# Patient Record
Sex: Male | Born: 1980 | Race: Black or African American | Hispanic: No | Marital: Single | Smoking: Current every day smoker
Health system: Southern US, Community
[De-identification: ages and names within clinical notes are randomized; demographics above are authoritative.]

## PROBLEM LIST (undated history)

## (undated) DIAGNOSIS — S61219A Laceration without foreign body of unspecified finger without damage to nail, initial encounter: Secondary | ICD-10-CM

---

## 2006-10-11 ENCOUNTER — Emergency Department (HOSPITAL_COMMUNITY): Admission: EM | Admit: 2006-10-11 | Discharge: 2006-10-11 | Payer: Self-pay | Admitting: Emergency Medicine

## 2009-11-29 ENCOUNTER — Emergency Department (HOSPITAL_COMMUNITY): Admission: EM | Admit: 2009-11-29 | Discharge: 2009-11-29 | Payer: Self-pay | Admitting: Emergency Medicine

## 2013-03-29 ENCOUNTER — Emergency Department (HOSPITAL_COMMUNITY)
Admission: EM | Admit: 2013-03-29 | Discharge: 2013-03-29 | Disposition: A | Discharge status: Home / Self Care | Payer: Self-pay | Attending: Emergency Medicine | Admitting: Emergency Medicine

## 2013-03-29 ENCOUNTER — Encounter (HOSPITAL_COMMUNITY): Payer: Self-pay

## 2013-03-29 DIAGNOSIS — F17200 Nicotine dependence, unspecified, uncomplicated: Secondary | ICD-10-CM | POA: Insufficient documentation

## 2013-03-29 DIAGNOSIS — R300 Dysuria: Secondary | ICD-10-CM | POA: Insufficient documentation

## 2013-03-29 DIAGNOSIS — R599 Enlarged lymph nodes, unspecified: Secondary | ICD-10-CM | POA: Insufficient documentation

## 2013-03-29 DIAGNOSIS — N342 Other urethritis: Secondary | ICD-10-CM | POA: Insufficient documentation

## 2013-03-29 LAB — URINE MICROSCOPIC-ADD ON

## 2013-03-29 LAB — URINALYSIS, ROUTINE W REFLEX MICROSCOPIC
Glucose, UA: NEGATIVE mg/dL
Hgb urine dipstick: NEGATIVE
Protein, ur: NEGATIVE mg/dL
Specific Gravity, Urine: 1.026 (ref 1.005–1.030)
pH: 5 (ref 5.0–8.0)

## 2013-03-29 MED ORDER — LIDOCAINE HCL (PF) 1 % IJ SOLN
INTRAMUSCULAR | Status: AC
  Administered 2013-03-29: 5 mL
  Filled 2013-03-29: qty 5

## 2013-03-29 MED ORDER — AZITHROMYCIN 1 G PO PACK
1.0000 g | PACK | Freq: Once | ORAL | Status: AC
  Administered 2013-03-29: 1 g via ORAL
  Filled 2013-03-29: qty 1

## 2013-03-29 MED ORDER — CEFTRIAXONE SODIUM 250 MG IJ SOLR
250.0000 mg | Freq: Once | INTRAMUSCULAR | Status: AC
  Administered 2013-03-29: 250 mg via INTRAMUSCULAR
  Filled 2013-03-29: qty 250

## 2013-03-29 NOTE — ED Notes (Signed)
Pt reports he thick, yellow penile discharge and burning with urination starting today.

## 2013-03-30 NOTE — ED Provider Notes (Signed)
Medical screening examination/treatment/procedure(s) were performed by non-physician practitioner and as supervising physician I was immediately available for consultation/collaboration.    Vida Roller, MD 03/30/13 [phone]

## 2013-03-30 NOTE — ED Provider Notes (Signed)
CSN: [id number]     Arrival date & time 03/29/13  1926 History   First MD Initiated Contact with Patient 03/29/13 1956     Chief Complaint  Patient presents with  . V71.5   (Consider location/radiation/quality/duration/timing/severity/associated sxs/prior Treatment) HPI Comments: Patient here with a day history of yellow penile discharge and burning with urination.  States has spoken with his partner who reports no symptoms herself.  Denies any previous history of same, denies fever, chills, testicle pain, groin pain or hematuria.  Patient is a 32 y.o. male presenting with male genitourinary complaint. The history is provided by the patient. No language interpreter was used.  Male GU Problem Presenting symptoms: dysuria and penile discharge   Presenting symptoms: no penile pain and no scrotal pain   Context: after urination   Relieved by:  Nothing Worsened by:  Nothing tried Ineffective treatments:  None tried Associated symptoms: no abdominal pain, no fever, no genital itching, no genital lesions, no genital rash, no groin pain, no hematuria, no penile redness, no scrotal swelling and no vomiting   Risk factors: new sexual partner and unprotected sex     History reviewed. No pertinent past medical history. History reviewed. No pertinent past surgical history. History reviewed. No pertinent family history. History  Substance Use Topics  . Smoking status: Current Every Day Smoker  . Smokeless tobacco: Not on file  . Alcohol Use: Yes    Review of Systems  Constitutional: Negative for fever.  Gastrointestinal: Negative for vomiting and abdominal pain.  Genitourinary: Positive for dysuria and discharge. Negative for hematuria, scrotal swelling and penile pain.  All other systems reviewed and are negative.    Allergies  Review of patient's allergies indicates no known allergies.  Home Medications  No current outpatient prescriptions on file. BP 129/90  Pulse 90  Temp(Src)  98.6 F (37 C) (Oral)  Resp 18  SpO2 99% Physical Exam  Nursing note and vitals reviewed. Constitutional: He appears well-developed and well-nourished. No distress.  HENT:  Head: Normocephalic and atraumatic.  Eyes: Conjunctivae are normal. Pupils are equal, round, and reactive to light. No scleral icterus.  Pulmonary/Chest: Effort normal.  Abdominal: Hernia confirmed negative in the right inguinal area and confirmed negative in the left inguinal area.  Genitourinary: Testes normal. Right testis shows no mass, no swelling and no tenderness. Left testis shows no mass, no swelling and no tenderness. Circumcised. Discharge found.  Lymphadenopathy:       Right: Inguinal adenopathy present.       Left: Inguinal adenopathy present.    ED Course  Procedures (including critical care time) Labs Review Labs Reviewed  URINALYSIS, ROUTINE W REFLEX MICROSCOPIC - Abnormal; Notable for the following:    Ketones, ur 15 (*)    Leukocytes, UA SMALL (*)    All other components within normal limits  GC/CHLAMYDIA PROBE AMP  URINE MICROSCOPIC-ADD ON  RPR  HIV ANTIBODY (ROUTINE TESTING)   Imaging Review No results found.  MDM   1. Urethritis    Patient with unprotected intercourse now with yellow discharge, he is concerned about other STD's so RPR and HIV sent as well.  Given rocephin 250mg  IM and zithromycin 1gm po to treat.    Izola Price Marisue Humble, PA-C 03/30/13 [phone]

## 2014-02-11 ENCOUNTER — Emergency Department (HOSPITAL_COMMUNITY)
Admission: EM | Admit: 2014-02-11 | Discharge: 2014-02-11 | Disposition: A | Discharge status: Home / Self Care | Payer: Self-pay | Attending: Emergency Medicine | Admitting: Emergency Medicine

## 2014-02-11 ENCOUNTER — Encounter (HOSPITAL_COMMUNITY): Payer: Self-pay | Admitting: Emergency Medicine

## 2014-02-11 DIAGNOSIS — J029 Acute pharyngitis, unspecified: Secondary | ICD-10-CM | POA: Insufficient documentation

## 2014-02-11 DIAGNOSIS — F17200 Nicotine dependence, unspecified, uncomplicated: Secondary | ICD-10-CM | POA: Insufficient documentation

## 2014-02-11 LAB — RAPID STREP SCREEN (MED CTR MEBANE ONLY): STREPTOCOCCUS, GROUP A SCREEN (DIRECT): NEGATIVE

## 2014-02-11 MED ORDER — HYDROCODONE-ACETAMINOPHEN 7.5-325 MG/15ML PO SOLN
10.0000 mL | Freq: Once | ORAL | Status: AC
  Administered 2014-02-11: 10 mL via ORAL
  Filled 2014-02-11: qty 15

## 2014-02-11 MED ORDER — LIDOCAINE VISCOUS 2 % MT SOLN
15.0000 mL | Freq: Once | OROMUCOSAL | Status: AC
  Administered 2014-02-11: 15 mL via OROMUCOSAL
  Filled 2014-02-11: qty 15

## 2014-02-11 MED ORDER — HYDROCODONE-ACETAMINOPHEN 7.5-325 MG/15ML PO SOLN: 15.0000 mL | Freq: Four times a day (QID) | ORAL | Status: DC | PRN

## 2014-02-11 NOTE — ED Notes (Signed)
Patient also ambulatory in room and in hallway without incident.

## 2014-02-11 NOTE — ED Provider Notes (Signed)
CSN: [id number]     Arrival date & time 02/11/14  [id number] History   First MD Initiated Contact with Patient 02/11/14 0458     Chief Complaint  Patient presents with  . Sore Throat     (Consider location/radiation/quality/duration/timing/severity/associated sxs/prior Treatment) Patient is a 33 y.o. male presenting with pharyngitis. The history is provided by the patient.  Sore Throat This is a new problem. The current episode started today. The problem occurs constantly. The problem has been unchanged. Associated symptoms include a sore throat and swollen glands. Pertinent negatives include no abdominal pain, anorexia, chest pain, chills, congestion, coughing, fever, headaches, myalgias, nausea, neck pain or vomiting. The symptoms are aggravated by eating and drinking. He has tried nothing for the symptoms. The treatment provided no relief.    History reviewed. No pertinent past medical history. History reviewed. No pertinent past surgical history. No family history on file. History  Substance Use Topics  . Smoking status: Current Every Day Smoker  . Smokeless tobacco: Not on file  . Alcohol Use: Yes    Review of Systems  Constitutional: Negative for fever and chills.  HENT: Positive for sore throat. Negative for congestion.   Respiratory: Negative for cough.   Cardiovascular: Negative for chest pain.  Gastrointestinal: Negative for nausea, vomiting, abdominal pain and anorexia.  Musculoskeletal: Negative for myalgias and neck pain.  Neurological: Negative for headaches.  All other systems reviewed and are negative.     Allergies  Review of patient's allergies indicates no known allergies.  Home Medications   Prior to Admission medications   Not on File   BP 132/85  Pulse 77  Temp(Src) 99.3 F (37.4 C) (Oral)  Resp 16  SpO2 100% Physical Exam  Nursing note and vitals reviewed. Constitutional: He is oriented to person, place, and time. He appears well-developed and  well-nourished. No distress.  HENT:  Head: Normocephalic and atraumatic.  Right Ear: External ear normal.  Left Ear: External ear normal.  Nose: Nose normal.  Mouth/Throat: Uvula is midline and mucous membranes are normal. No trismus in the jaw. No uvula swelling. Posterior oropharyngeal erythema present. No oropharyngeal exudate, posterior oropharyngeal edema or tonsillar abscesses.  Eyes: Conjunctivae are normal.  Neck: Normal range of motion. Neck supple.  Cardiovascular: Normal rate.   Pulmonary/Chest: Effort normal.  Abdominal: Soft.  Musculoskeletal: Normal range of motion.  Lymphadenopathy:    He has cervical adenopathy.  Neurological: He is alert and oriented to person, place, and time.  Skin: Skin is warm and dry. He is not diaphoretic.  Psychiatric: He has a normal mood and affect.    ED Course  Procedures (including critical care time) Medications  lidocaine (XYLOCAINE) 2 % viscous mouth solution 15 mL (15 mLs Mouth/Throat Given 02/11/14 0519)  HYDROcodone-acetaminophen (HYCET) 7.5-325 mg/15 ml solution 10 mL (10 mLs Oral Given 02/11/14 0547)    Labs Review Labs Reviewed  RAPID STREP SCREEN    Imaging Review No results found.   EKG Interpretation None      MDM   Final diagnoses:  Viral pharyngitis    Filed Vitals:   02/11/14 0454  BP: 132/85  Pulse: 77  Temp: 99.3 F (37.4 C)  Resp: 16   Afebrile, NAD, non-toxic appearing, AAOx4.  Pt afebrile without tonsillar exudate, negative strep. Presents with mild cervical lymphadenopathy, & dysphagia; diagnosis of viral pharyngitis. No abx indicated. DC w symptomatic tx for pain  Pt does not appear dehydrated, but did discuss importance of water rehydration. Presentation non concerning  for PTA or infxn spread to soft tissue. No trismus or uvula deviation. Specific return precautions discussed. Pt able to drink water in ED without difficulty with intact air way. Recommended PCP follow up. Patient is stable at time  of discharge      Jeannetta Ellis, PA-C 02/11/14 1610

## 2014-02-11 NOTE — Discharge Instructions (Signed)
Please follow up with your primary care physician in 1-2 days. If you do not have one please call the [facility] number listed above. Please alternate between Motrin and Tylenol every three hours for fevers and pain. Please do not drive on [place] it contains a narcotic and may make you drowsy. Please read all discharge instructions and return precautions.   Pharyngitis Pharyngitis is redness, pain, and swelling (inflammation) of your pharynx.  CAUSES  Pharyngitis is usually caused by infection. Most of the time, these infections are from viruses (viral) and are part of a cold. However, sometimes pharyngitis is caused by bacteria (bacterial). Pharyngitis can also be caused by allergies. Viral pharyngitis may be spread from person to person by coughing, sneezing, and personal items or utensils (cups, forks, spoons, toothbrushes). Bacterial pharyngitis may be spread from person to person by more intimate contact, such as kissing.  SIGNS AND SYMPTOMS  Symptoms of pharyngitis include:   Sore throat.   Tiredness (fatigue).   Low-grade fever.   Headache.  Joint pain and muscle aches.  Skin rashes.  Swollen lymph nodes.  Plaque-like film on throat or tonsils (often seen with bacterial pharyngitis). DIAGNOSIS  Your health care provider will ask you questions about your illness and your symptoms. Your medical history, along with a physical exam, is often all that is needed to diagnose pharyngitis. Sometimes, a rapid strep test is done. Other lab tests may also be done, depending on the suspected cause.  TREATMENT  Viral pharyngitis will usually get better in 3-4 days without the use of medicine. Bacterial pharyngitis is treated with medicines that kill germs (antibiotics).  HOME CARE INSTRUCTIONS   Drink enough water and fluids to keep your urine clear or pale yellow.   Only take over-the-counter or prescription medicines as directed by your health care provider:    If you are prescribed antibiotics, make sure you finish them even if you start to feel better.   Do not take aspirin.   Get lots of rest.   Gargle with 8 oz of salt water ( tsp of salt per 1 qt of water) as often as every 1-2 hours to soothe your throat.   Throat lozenges (if you are not at risk for choking) or sprays may be used to soothe your throat. SEEK MEDICAL CARE IF:   You have large, tender lumps in your neck.  You have a rash.  You cough up green, yellow-brown, or bloody spit. SEEK IMMEDIATE MEDICAL CARE IF:   Your neck becomes stiff.  You drool or are unable to swallow liquids.  You vomit or are unable to keep medicines or liquids down.  You have severe pain that does not go away with the use of recommended medicines.  You have trouble breathing (not caused by a stuffy nose). MAKE SURE YOU:   Understand these instructions.  Will watch your condition.  Will get help right away if you are not doing well or get worse. Document Released: 06/10/2005 Document Revised: 03/31/2013 Document Reviewed: 02/15/2013 [facility] Patient Information 2015 [employer] [place]. This information is not intended to replace advice given to you by your health care provider. Make sure you discuss any questions you have with your health care provider.

## 2014-02-11 NOTE — ED Notes (Signed)
Pt presents with c/o sore throat that started several hours ago. Pt says his throat is swollen on the left side and it is hard for him to swallow.

## 2014-02-11 NOTE — ED Provider Notes (Signed)
Medical screening examination/treatment/procedure(s) were performed by non-physician practitioner and as supervising physician I was immediately available for consultation/collaboration.   EKG Interpretation None       Abrina Petz M Dennison Mcdaid, MD 02/11/14 0603 

## 2014-02-11 NOTE — ED Notes (Signed)
Patient presents with "feeling like throat closing". Denies N/V, fever, chills, cough. Patient speaking in complete sentences. Patient's 02 100% on room air.

## 2014-02-13 LAB — CULTURE, GROUP A STREP

## 2014-09-01 ENCOUNTER — Emergency Department (HOSPITAL_COMMUNITY): Payer: Self-pay

## 2014-09-01 ENCOUNTER — Encounter (HOSPITAL_COMMUNITY): Payer: Self-pay | Admitting: Emergency Medicine

## 2014-09-01 ENCOUNTER — Emergency Department (HOSPITAL_COMMUNITY)
Admission: EM | Admit: 2014-09-01 | Discharge: 2014-09-02 | Disposition: A | Discharge status: Home / Self Care | Payer: Self-pay | Attending: Emergency Medicine | Admitting: Emergency Medicine

## 2014-09-01 DIAGNOSIS — S61412A Laceration without foreign body of left hand, initial encounter: Secondary | ICD-10-CM

## 2014-09-01 DIAGNOSIS — Y9289 Other specified places as the place of occurrence of the external cause: Secondary | ICD-10-CM | POA: Insufficient documentation

## 2014-09-01 DIAGNOSIS — IMO0002 Reserved for concepts with insufficient information to code with codable children: Secondary | ICD-10-CM

## 2014-09-01 DIAGNOSIS — Y998 Other external cause status: Secondary | ICD-10-CM | POA: Insufficient documentation

## 2014-09-01 DIAGNOSIS — W25XXXA Contact with sharp glass, initial encounter: Secondary | ICD-10-CM | POA: Insufficient documentation

## 2014-09-01 DIAGNOSIS — S61412S Laceration without foreign body of left hand, sequela: Secondary | ICD-10-CM | POA: Insufficient documentation

## 2014-09-01 DIAGNOSIS — Y9389 Activity, other specified: Secondary | ICD-10-CM | POA: Insufficient documentation

## 2014-09-01 DIAGNOSIS — S61219A Laceration without foreign body of unspecified finger without damage to nail, initial encounter: Secondary | ICD-10-CM

## 2014-09-01 DIAGNOSIS — Z87828 Personal history of other (healed) physical injury and trauma: Secondary | ICD-10-CM | POA: Insufficient documentation

## 2014-09-01 DIAGNOSIS — S51812A Laceration without foreign body of left forearm, initial encounter: Secondary | ICD-10-CM

## 2014-09-01 DIAGNOSIS — Z720 Tobacco use: Secondary | ICD-10-CM | POA: Insufficient documentation

## 2014-09-01 DIAGNOSIS — R011 Cardiac murmur, unspecified: Secondary | ICD-10-CM | POA: Insufficient documentation

## 2014-09-01 HISTORY — DX: Laceration without foreign body of unspecified finger without damage to nail, initial encounter: S61.219A

## 2014-09-01 MED ORDER — CEPHALEXIN 500 MG PO CAPS: 500.0000 mg | ORAL_CAPSULE | Freq: Four times a day (QID) | ORAL | Status: DC

## 2014-09-01 MED ORDER — HYDROCODONE-ACETAMINOPHEN 5-325 MG PO TABS: 1.0000 | ORAL_TABLET | ORAL | Status: DC | PRN

## 2014-09-01 MED ORDER — LIDOCAINE-EPINEPHRINE 2 %-1:100000 IJ SOLN
20.0000 mL | Freq: Once | INTRAMUSCULAR | Status: AC
  Administered 2014-09-02: 20 mL
  Filled 2014-09-01: qty 1

## 2014-09-01 MED ORDER — LIDOCAINE HCL 2 % IJ SOLN
INTRAMUSCULAR | Status: AC
  Administered 2014-09-02
  Filled 2014-09-01: qty 20

## 2014-09-01 NOTE — ED Provider Notes (Signed)
CSN: [id number]     Arrival date & time 09/01/14  2024 History   First MD Initiated Contact with Patient 09/01/14 2105     Chief Complaint  Patient presents with  . Extremity Laceration     (Consider location/radiation/quality/duration/timing/severity/associated sxs/prior Treatment) Patient is a 34 y.o. male presenting with skin laceration.  Laceration Location: L forearm, L hand. Length (cm):  6 cm, 2 cm, 6 cm in hand Depth:  Through muscle Quality: straight   Bleeding: controlled   Time since incident:  2 hours Injury mechanism: broken glass. Pain details:    Quality:  Aching   Severity:  Moderate   Timing:  Constant Foreign body present:  No foreign bodies Relieved by:  Nothing Worsened by:  Movement and pressure Ineffective treatments:  None tried Tetanus status:  Up to date   Past Medical History  Diagnosis Date  . Heart murmur     diagnosed as a child, never had any problems, per pt. and mother  . Laceration of forearm, left 09/01/2014  . Finger laceration involving tendon 09/01/2014    left long and ring fingers   Past Surgical History  Procedure Laterality Date  . No past surgeries     History reviewed. No pertinent family history. History  Substance Use Topics  . Smoking status: Current Every Day Smoker -- 10 years    Types: Cigars  . Smokeless tobacco: Never Used     Comment: 1/2 of a Black and Mild/day  . Alcohol Use: Yes     Comment: occasionally    Review of Systems  All other systems reviewed and are negative.     Allergies  Review of patient's allergies indicates no known allergies.  Home Medications   Prior to Admission medications   Medication Sig Start Date End Date Taking? Authorizing Provider  cephALEXin (KEFLEX) 500 MG capsule Take 1 capsule (500 mg total) by mouth 4 (four) times daily. 09/01/14   Mirian Mo, MD  HYDROcodone-acetaminophen (NORCO/VICODIN) 5-325 MG per tablet Take 1-2 tablets by mouth every 4 (four) hours as  needed. 09/01/14   Mirian Mo, MD  Multiple Vitamins-Minerals (RA ONE DAILY GUMMY VITES PO) Take by mouth.    Historical Provider, MD   BP 129/77 mmHg  Pulse 61  Temp(Src) 98.4 F (36.9 C) (Oral)  Resp 18  SpO2 99% Physical Exam  Constitutional: He is oriented to person, place, and time. He appears well-developed and well-nourished.  HENT:  Head: Normocephalic and atraumatic.  Eyes: Conjunctivae and EOM are normal.  Neck: Normal range of motion. Neck supple.  Cardiovascular: Normal rate, regular rhythm and normal heart sounds.   Pulmonary/Chest: Effort normal and breath sounds normal. No respiratory distress.  Abdominal: He exhibits no distension. There is no tenderness. There is no rebound and no guarding.  Musculoskeletal: Normal range of motion.  Neurological: He is alert and oriented to person, place, and time.  Skin: Skin is warm and dry.  6 cm laceration to medial L forearm, 2 cm to lateral L forearm, 6 cm laceration to L hand palmar surface extending to digit  Vitals reviewed.   ED Course  LACERATION REPAIR Date/Time: 09/02/2014 4:33 PM Performed by: Mirian Mo Authorized by: Mirian Mo Consent: Verbal consent obtained. Body area: upper extremity Location details: left lower arm Laceration length: 6 cm Tendon involvement: none Nerve involvement: none Vascular damage: no Anesthesia: local infiltration Local anesthetic: lidocaine 2% with epinephrine Patient sedated: no Preparation: Patient was prepped and draped in the usual sterile fashion.  Irrigation solution: saline Irrigation method: syringe Amount of cleaning: standard Skin closure: 3-0 Prolene Wound subcutaneous closure material used: 5-0 vicryl. Number of sutures: 11 Technique: simple Approximation: close Approximation difficulty: complex Dressing: 4x4 sterile gauze Patient tolerance: Patient tolerated the procedure well with no immediate complications  LACERATION REPAIR Date/Time:  09/02/2014 4:43 PM Performed by: Mirian MoGENTRY, MATTHEW Authorized by: Mirian MoGENTRY, MATTHEW Consent: Verbal consent obtained. Body area: upper extremity Location details: left lower arm Laceration length: 2 cm Foreign bodies: no foreign bodies Tendon involvement: none Nerve involvement: none Anesthesia: local infiltration Local anesthetic: lidocaine 2% with epinephrine Irrigation solution: saline Irrigation method: syringe Amount of cleaning: standard Debridement: none Degree of undermining: none Skin closure: 3-0 Prolene Number of sutures: 2 Technique: simple Approximation: close Approximation difficulty: simple Patient tolerance: Patient tolerated the procedure well with no immediate complications  LACERATION REPAIR Date/Time: 09/02/2014 4:47 PM Performed by: Mirian MoGENTRY, MATTHEW Authorized by: Mirian MoGENTRY, MATTHEW Consent: Verbal consent obtained. Body area: upper extremity Location details: left hand Laceration length: 6 cm Foreign bodies: no foreign bodies Tendon involvement: none Nerve involvement: none Vascular damage: no Anesthesia: local infiltration and digital block Local anesthetic: lidocaine 1% without epinephrine Preparation: Patient was prepped and draped in the usual sterile fashion. Irrigation solution: saline Irrigation method: syringe Amount of cleaning: standard Debridement: none Degree of undermining: none Skin closure: 4-0 Prolene Technique: simple Approximation: loose Approximation difficulty: complex Patient tolerance: Patient tolerated the procedure well with no immediate complications Comments: Loose approximation due to wound gaping and location   (including critical care time) Labs Review Labs Reviewed - No data to display  Imaging Review Dg Forearm Left  09/01/2014   CLINICAL DATA:  Patient's hand and arm went through glass door this afternoon. Laceration to the palmar surface of the hand and anterior forearm, midshaft.  EXAM: LEFT FOREARM - 2 VIEW   COMPARISON:  None.  FINDINGS: Gauze material around the mid left forearm. No evidence of acute fracture or subluxation. No focal bone lesion or bone destruction. Bone cortex and trabecular architecture appear intact. No radiopaque soft tissue foreign bodies.  IMPRESSION: No acute bony abnormalities in the left radius or ulna. No radiopaque soft tissue foreign bodies.   Electronically Signed   By: Burman NievesWilliam  Stevens M.D.   On: 09/01/2014 21:49   Dg Hand Complete Left  09/01/2014   CLINICAL DATA:  Left hand and arm went through glass door, laceration to palmar surface hand.  EXAM: LEFT HAND - COMPLETE 3+ VIEW  COMPARISON:  None.  FINDINGS: A dressing overlies the second, third, and fourth digits. Probable laceration about the palmar aspect in the region of the dressing. No radiopaque foreign body. No acute fracture or dislocation.  IMPRESSION: Laceration about the palmar second, third, and fourth digits. No radiopaque foreign body or acute osseous abnormality.   Electronically Signed   By: Rubye OaksMelanie  Ehinger M.D.   On: 09/01/2014 21:51     EKG Interpretation None      MDM   Final diagnoses:  Laceration  Hand laceration, left, initial encounter  Forearm laceration, left, initial encounter    34 y.o. male without pertinent PMH  presents with lacerations to LUE from broken glass.  On arrival vitals and physical exam as above.  Pt has FROM, intact strength with ? Mild limiting, unknown if from pain or from partial tendon disruption.  Consulted hand, Dr. Merlyn LotKuzma, who requested primary closure in ED and fu as outpt.  Tetanus UTD.  Complex repair as above.  NV intact before and after.  Pt dc home after splinting to fu.  Strict return precautions and abx prescribed.    I have reviewed all laboratory and imaging studies if ordered as above  1. Hand laceration, left, initial encounter   2. Laceration   3. Forearm laceration, left, initial encounter         Mirian Mo, MD 09/02/14 1650

## 2014-09-01 NOTE — ED Notes (Signed)
Pt reports his L arm through a glass door tonight.  Large lac noted to L medial FA and skin avulsion to L ring finger.  Active bleeding noted.

## 2014-09-01 NOTE — Discharge Instructions (Signed)

## 2014-09-01 NOTE — ED Notes (Signed)
Patient transported to X-ray 

## 2014-09-01 NOTE — ED Notes (Signed)
Pt states he was coming in a glass door and it went to close so he went to catch it and his hand went through it  Pt has lacerations noted to his left hand and left arm  Bleeding not controlled

## 2014-09-02 ENCOUNTER — Encounter (HOSPITAL_BASED_OUTPATIENT_CLINIC_OR_DEPARTMENT_OTHER): Payer: Self-pay | Admitting: *Deleted

## 2014-09-02 ENCOUNTER — Other Ambulatory Visit: Payer: Self-pay | Admitting: Orthopedic Surgery

## 2014-09-05 ENCOUNTER — Ambulatory Visit (HOSPITAL_BASED_OUTPATIENT_CLINIC_OR_DEPARTMENT_OTHER)
Admission: RE | Admit: 2014-09-05 | Discharge: 2014-09-05 | Disposition: A | Discharge status: Home / Self Care | Payer: Self-pay | Source: Ambulatory Visit | Attending: Orthopedic Surgery | Admitting: Orthopedic Surgery

## 2014-09-05 ENCOUNTER — Ambulatory Visit (HOSPITAL_BASED_OUTPATIENT_CLINIC_OR_DEPARTMENT_OTHER): Payer: Self-pay | Admitting: Anesthesiology

## 2014-09-05 ENCOUNTER — Encounter (HOSPITAL_BASED_OUTPATIENT_CLINIC_OR_DEPARTMENT_OTHER): Payer: Self-pay

## 2014-09-05 ENCOUNTER — Encounter (HOSPITAL_BASED_OUTPATIENT_CLINIC_OR_DEPARTMENT_OTHER)
Admission: RE | Disposition: A | Discharge status: Home / Self Care | Payer: Self-pay | Source: Ambulatory Visit | Attending: Orthopedic Surgery

## 2014-09-05 DIAGNOSIS — S64493A Injury of digital nerve of left middle finger, initial encounter: Secondary | ICD-10-CM | POA: Insufficient documentation

## 2014-09-05 DIAGNOSIS — W25XXXA Contact with sharp glass, initial encounter: Secondary | ICD-10-CM | POA: Insufficient documentation

## 2014-09-05 DIAGNOSIS — S51812A Laceration without foreign body of left forearm, initial encounter: Secondary | ICD-10-CM | POA: Insufficient documentation

## 2014-09-05 DIAGNOSIS — F17210 Nicotine dependence, cigarettes, uncomplicated: Secondary | ICD-10-CM | POA: Insufficient documentation

## 2014-09-05 DIAGNOSIS — S66123A Laceration of flexor muscle, fascia and tendon of left middle finger at wrist and hand level, initial encounter: Secondary | ICD-10-CM | POA: Insufficient documentation

## 2014-09-05 DIAGNOSIS — S66125A Laceration of flexor muscle, fascia and tendon of left ring finger at wrist and hand level, initial encounter: Secondary | ICD-10-CM | POA: Insufficient documentation

## 2014-09-05 HISTORY — DX: Laceration without foreign body of unspecified finger without damage to nail, initial encounter: S61.219A

## 2014-09-05 HISTORY — PX: WOUND EXPLORATION: SHX6188

## 2014-09-05 HISTORY — PX: NERVE, TENDON AND ARTERY REPAIR: SHX5695

## 2014-09-05 SURGERY — WOUND EXPLORATION
Anesthesia: General | Site: Hand | Laterality: Left

## 2014-09-05 MED ORDER — CEFAZOLIN SODIUM-DEXTROSE 2-3 GM-% IV SOLR
INTRAVENOUS | Status: AC
  Filled 2014-09-05: qty 50

## 2014-09-05 MED ORDER — ONDANSETRON HCL 4 MG/2ML IJ SOLN
INTRAMUSCULAR | Status: DC | PRN
  Administered 2014-09-05: 4 mg via INTRAVENOUS

## 2014-09-05 MED ORDER — FENTANYL CITRATE 0.05 MG/ML IJ SOLN
INTRAMUSCULAR | Status: AC
Start: 2014-09-05 — End: 2014-09-05
  Filled 2014-09-05: qty 6

## 2014-09-05 MED ORDER — LIDOCAINE HCL (CARDIAC) 20 MG/ML IV SOLN
INTRAVENOUS | Status: DC | PRN
  Administered 2014-09-05: 80 mg via INTRAVENOUS

## 2014-09-05 MED ORDER — DEXAMETHASONE SODIUM PHOSPHATE 10 MG/ML IJ SOLN
INTRAMUSCULAR | Status: DC | PRN
  Administered 2014-09-05: 10 mg via INTRAVENOUS

## 2014-09-05 MED ORDER — FENTANYL CITRATE 0.05 MG/ML IJ SOLN
INTRAMUSCULAR | Status: DC | PRN
  Administered 2014-09-05 (×5): 25 ug via INTRAVENOUS
  Administered 2014-09-05: 100 ug via INTRAVENOUS

## 2014-09-05 MED ORDER — MIDAZOLAM HCL 2 MG/ML PO SYRP
12.0000 mg | ORAL_SOLUTION | Freq: Once | ORAL | Status: DC | PRN
Start: 2014-09-05 — End: 2014-09-05

## 2014-09-05 MED ORDER — ONDANSETRON HCL 4 MG/2ML IJ SOLN: 4.0000 mg | Freq: Four times a day (QID) | INTRAMUSCULAR | Status: DC | PRN

## 2014-09-05 MED ORDER — MIDAZOLAM HCL 2 MG/2ML IJ SOLN: 1.0000 mg | INTRAMUSCULAR | Status: DC | PRN

## 2014-09-05 MED ORDER — BUPIVACAINE HCL (PF) 0.25 % IJ SOLN
INTRAMUSCULAR | Status: AC
  Filled 2014-09-05: qty 30

## 2014-09-05 MED ORDER — FENTANYL CITRATE 0.05 MG/ML IJ SOLN: 50.0000 ug | INTRAMUSCULAR | Status: DC | PRN

## 2014-09-05 MED ORDER — CEFAZOLIN SODIUM-DEXTROSE 2-3 GM-% IV SOLR
2.0000 g | INTRAVENOUS | Status: AC
  Administered 2014-09-05: 2 g via INTRAVENOUS

## 2014-09-05 MED ORDER — FENTANYL CITRATE 0.05 MG/ML IJ SOLN: 25.0000 ug | INTRAMUSCULAR | Status: DC | PRN

## 2014-09-05 MED ORDER — OXYCODONE HCL 5 MG PO TABS
5.0000 mg | ORAL_TABLET | Freq: Once | ORAL | Status: AC | PRN
  Administered 2014-09-05: 5 mg via ORAL

## 2014-09-05 MED ORDER — PROPOFOL 10 MG/ML IV BOLUS
INTRAVENOUS | Status: DC | PRN
  Administered 2014-09-05: 200 mg via INTRAVENOUS

## 2014-09-05 MED ORDER — OXYCODONE HCL 5 MG PO TABS
ORAL_TABLET | ORAL | Status: AC
  Filled 2014-09-05: qty 1

## 2014-09-05 MED ORDER — CHLORHEXIDINE GLUCONATE 4 % EX LIQD: 60.0000 mL | Freq: Once | CUTANEOUS | Status: DC

## 2014-09-05 MED ORDER — LACTATED RINGERS IV SOLN
INTRAVENOUS | Status: DC
  Administered 2014-09-05 (×2): via INTRAVENOUS

## 2014-09-05 MED ORDER — MIDAZOLAM HCL 5 MG/5ML IJ SOLN
INTRAMUSCULAR | Status: DC | PRN
  Administered 2014-09-05: 2 mg via INTRAVENOUS

## 2014-09-05 MED ORDER — MIDAZOLAM HCL 2 MG/2ML IJ SOLN
INTRAMUSCULAR | Status: AC
  Filled 2014-09-05: qty 2

## 2014-09-05 MED ORDER — OXYCODONE-ACETAMINOPHEN 5-325 MG PO TABS: ORAL_TABLET | ORAL | Status: DC

## 2014-09-05 MED ORDER — HEPARIN SODIUM (PORCINE) 1000 UNIT/ML IJ SOLN
INTRAMUSCULAR | Status: AC
  Filled 2014-09-05: qty 1

## 2014-09-05 MED ORDER — OXYCODONE HCL 5 MG/5ML PO SOLN: 5.0000 mg | Freq: Once | ORAL | Status: AC | PRN

## 2014-09-05 MED ORDER — BUPIVACAINE HCL (PF) 0.25 % IJ SOLN
INTRAMUSCULAR | Status: DC | PRN
  Administered 2014-09-05: 10 mL

## 2014-09-05 MED ORDER — LIDOCAINE HCL (PF) 1 % IJ SOLN
INTRAMUSCULAR | Status: AC
  Filled 2014-09-05: qty 30

## 2014-09-05 SURGICAL SUPPLY — 68 items
BAG DECANTER FOR FLEXI CONT (MISCELLANEOUS) IMPLANT
BANDAGE ELASTIC 3 VELCRO ST LF (GAUZE/BANDAGES/DRESSINGS) ×1 IMPLANT
BLADE MINI RND TIP GREEN BEAV (BLADE) IMPLANT
BLADE SURG 15 STRL LF DISP TIS (BLADE) ×2 IMPLANT
BLADE SURG 15 STRL SS (BLADE) ×6
BNDG CMPR 9X4 STRL LF SNTH (GAUZE/BANDAGES/DRESSINGS) ×1
BNDG ESMARK 4X9 LF (GAUZE/BANDAGES/DRESSINGS) ×2 IMPLANT
BNDG GAUZE ELAST 4 BULKY (GAUZE/BANDAGES/DRESSINGS) ×3 IMPLANT
BRUSH SCRUB EZ PLAIN DRY (MISCELLANEOUS) ×3 IMPLANT
CHLORAPREP W/TINT 26ML (MISCELLANEOUS) ×1 IMPLANT
CORDS BIPOLAR (ELECTRODE) ×3 IMPLANT
COVER BACK TABLE 60X90IN (DRAPES) ×3 IMPLANT
COVER MAYO STAND STRL (DRAPES) ×3 IMPLANT
CUFF TOURNIQUET SINGLE 18IN (TOURNIQUET CUFF) ×2 IMPLANT
DECANTER SPIKE VIAL GLASS SM (MISCELLANEOUS) ×1 IMPLANT
DRAPE EXTREMITY T 121X128X90 (DRAPE) ×3 IMPLANT
DRAPE SURG 17X23 STRL (DRAPES) ×3 IMPLANT
GAUZE SPONGE 4X4 12PLY STRL (GAUZE/BANDAGES/DRESSINGS) ×3 IMPLANT
GAUZE XEROFORM 1X8 LF (GAUZE/BANDAGES/DRESSINGS) ×3 IMPLANT
GLOVE BIO SURGEON STRL SZ 6.5 (GLOVE) ×1 IMPLANT
GLOVE BIO SURGEON STRL SZ7.5 (GLOVE) ×3 IMPLANT
GLOVE BIO SURGEONS STRL SZ 6.5 (GLOVE) ×1
GLOVE BIOGEL M STRL SZ7.5 (GLOVE) ×2 IMPLANT
GLOVE BIOGEL PI IND STRL 7.0 (GLOVE) IMPLANT
GLOVE BIOGEL PI IND STRL 8 (GLOVE) ×1 IMPLANT
GLOVE BIOGEL PI IND STRL 8.5 (GLOVE) IMPLANT
GLOVE BIOGEL PI INDICATOR 7.0 (GLOVE) ×2
GLOVE BIOGEL PI INDICATOR 8 (GLOVE) ×4
GLOVE BIOGEL PI INDICATOR 8.5 (GLOVE) ×6
GLOVE SURG ORTHO 8.0 STRL STRW (GLOVE) ×2 IMPLANT
GOWN STRL REUS W/ TWL LRG LVL3 (GOWN DISPOSABLE) ×1 IMPLANT
GOWN STRL REUS W/TWL LRG LVL3 (GOWN DISPOSABLE) ×3
GOWN STRL REUS W/TWL XL LVL3 (GOWN DISPOSABLE) ×5 IMPLANT
LOOP VESSEL MAXI BLUE (MISCELLANEOUS) IMPLANT
NDL HYPO 25X1 1.5 SAFETY (NEEDLE) IMPLANT
NDL SAFETY ECLIPSE 18X1.5 (NEEDLE) IMPLANT
NEEDLE HYPO 18GX1.5 SHARP (NEEDLE)
NEEDLE HYPO 25X1 1.5 SAFETY (NEEDLE) ×3 IMPLANT
NS IRRIG 1000ML POUR BTL (IV SOLUTION) ×3 IMPLANT
PACK BASIN DAY SURGERY FS (CUSTOM PROCEDURE TRAY) ×3 IMPLANT
PAD CAST 3X4 CTTN HI CHSV (CAST SUPPLIES) ×1 IMPLANT
PAD CAST 4YDX4 CTTN HI CHSV (CAST SUPPLIES) IMPLANT
PADDING CAST ABS 4INX4YD NS (CAST SUPPLIES)
PADDING CAST ABS COTTON 4X4 ST (CAST SUPPLIES) ×1 IMPLANT
PADDING CAST COTTON 3X4 STRL (CAST SUPPLIES) ×3
PADDING CAST COTTON 4X4 STRL (CAST SUPPLIES)
SLEEVE SCD COMPRESS KNEE MED (MISCELLANEOUS) ×2 IMPLANT
SPEAR EYE SURG WECK-CEL (MISCELLANEOUS) ×3 IMPLANT
SPLINT PLASTER CAST XFAST 3X15 (CAST SUPPLIES) IMPLANT
SPLINT PLASTER XTRA FASTSET 3X (CAST SUPPLIES) ×28
STOCKINETTE 4X48 STRL (DRAPES) ×3 IMPLANT
SUT ETHIBOND 3-0 V-5 (SUTURE) IMPLANT
SUT ETHILON 3 0 PS 1 (SUTURE) ×2 IMPLANT
SUT ETHILON 4 0 PS 2 18 (SUTURE) ×3 IMPLANT
SUT FIBERWIRE 4-0 18 TAPR NDL (SUTURE) ×3
SUT MERSILENE 6 0 P 1 (SUTURE) IMPLANT
SUT NYLON 9 0 VRM6 (SUTURE) ×2 IMPLANT
SUT PROLENE 6 0 P 1 18 (SUTURE) IMPLANT
SUT SILK 2 0 FS (SUTURE) ×2 IMPLANT
SUT SILK 4 0 PS 2 (SUTURE) IMPLANT
SUT SUPRAMID 4-0 (SUTURE) ×2 IMPLANT
SUT VICRYL 4-0 PS2 18IN ABS (SUTURE) IMPLANT
SUTURE FIBERWR 4-0 18 TAPR NDL (SUTURE) IMPLANT
SYR BULB 3OZ (MISCELLANEOUS) ×3 IMPLANT
SYR CONTROL 10ML LL (SYRINGE) ×2 IMPLANT
TOWEL OR 17X24 6PK STRL BLUE (TOWEL DISPOSABLE) ×6 IMPLANT
TRAY DSU PREP LF (CUSTOM PROCEDURE TRAY) IMPLANT
UNDERPAD 30X30 INCONTINENT (UNDERPADS AND DIAPERS) ×3 IMPLANT

## 2014-09-05 NOTE — Brief Op Note (Signed)
09/05/2014  3:08 PM  PATIENT:  Kyle Snyder  34 y.o. male  PRE-OPERATIVE DIAGNOSIS:  LEFT LONG RING TENDON LACERATIONS/LEFT FOREARM LACERATION  POST-OPERATIVE DIAGNOSIS:  LEFT LONG RING TENDON LACERATIONS/LEFT FOREARM LACERATION  PROCEDURE:  Procedure(s): LEFT LONG RING /FOREARM EXPLORATION  WOUNDS (Left) LEFT NERVE, TENDON AND ARTERY REPAIR (Left)  SURGEON:  Surgeon(s) and Role:    * Betha LoaKevin Remedios Mckone, MD - Primary    * Cindee SaltGary Shanyn Preisler, MD - Assisting  PHYSICIAN ASSISTANT:   ASSISTANTS: Cindee SaltGary Willella Harding, MD   ANESTHESIA:   general  EBL:  Total I/O In: 1200 [I.V.:1200] Out: -   BLOOD ADMINISTERED:none  DRAINS: none   LOCAL MEDICATIONS USED:  MARCAINE     SPECIMEN:  No Specimen  DISPOSITION OF SPECIMEN:  N/A  COUNTS:  YES  TOURNIQUET:   Total Tourniquet Time Documented: Upper Arm (Left) - 85 minutes Total: Upper Arm (Left) - 85 minutes   DICTATION: .Other Dictation: Dictation Number [id number]  PLAN OF CARE: Discharge to home after PACU  PATIENT DISPOSITION:  PACU - hemodynamically stable.

## 2014-09-05 NOTE — Discharge Instructions (Addendum)

## 2014-09-05 NOTE — H&P (Signed)
  Kyle NordmannShawron Mcneely is an 34 y.o. male.   Chief Complaint: left long, ring, forearm lacerations HPI: 34 yo rhd male states he lacerated left hand and forearm on glass door 09/01/14.  [facility].  Followed up in office.  Difficulty with flexion of long and ring fingers at pip joint.  Past Medical History  Diagnosis Date  . Heart murmur     diagnosed as a child, never had any problems, per pt. and mother  . Laceration of forearm, left 09/01/2014  . Finger laceration involving tendon 09/01/2014    left long and ring fingers    Past Surgical History  Procedure Laterality Date  . No past surgeries      History reviewed. No pertinent family history. Social History:  reports that he has been smoking Cigars.  He has never used smokeless tobacco. He reports that he drinks alcohol. He reports that he does not use illicit drugs.  Allergies: No Known Allergies  Medications Prior to Admission  Medication Sig Dispense Refill  . cephALEXin (KEFLEX) 500 MG capsule Take 1 capsule (500 mg total) by mouth 4 (four) times daily. 28 capsule 0  . HYDROcodone-acetaminophen (NORCO/VICODIN) 5-325 MG per tablet Take 1-2 tablets by mouth every 4 (four) hours as needed. 10 tablet 0  . Multiple Vitamins-Minerals (RA ONE DAILY GUMMY VITES PO) Take by mouth.      No results found for this or any previous visit (from the past 48 hour(s)).  No results found.   A comprehensive review of systems was negative except for: Eyes: positive for contacts/glasses  Blood pressure 130/65, pulse 55, temperature 98.2 F (36.8 C), temperature source Oral, resp. rate 20, height 5\' 9"  (1.753 m), weight 61.689 kg (136 lb), SpO2 100 %.  General appearance: alert, cooperative and appears stated age Head: Normocephalic, without obvious abnormality, atraumatic Neck: supple, symmetrical, trachea midline Resp: clear to auscultation bilaterally Cardio: regular rate and rhythm GI: non tender Extremities:  intact sensation and capillary refill all digits.  +epl/fpl/io.  laceration in palm left hand and medial side of forearm.  difficulty with flexion of pip long and ring fingers.   Pulses: 2+ and symmetric Skin: Skin color, texture, turgor normal. No rashes or lesions Neurologic: Grossly normal Incision/Wound: As above  Assessment/Plan Left hand and forearm lacerations with possible tendon lacerations.  Non operative and operative treatment options were discussed with the patient and patient wishes to proceed with operative treatment. Risks, benefits, and alternatives of surgery were discussed and the patient agrees with the plan of care.   Dorma Altman R 09/05/2014, 12:19 PM

## 2014-09-05 NOTE — Op Note (Signed)
[id number] 

## 2014-09-05 NOTE — Anesthesia Preprocedure Evaluation (Signed)
Anesthesia Evaluation  Patient identified by MRN, date of birth, ID band Patient awake    Reviewed: Allergy & Precautions, NPO status , Patient's Chart, lab work & pertinent test results  Airway Mallampati: II   Neck ROM: full    Dental   Pulmonary Current Smoker,    breath sounds clear to auscultation       Cardiovascular negative cardio ROS   Rhythm:regular Rate:Normal     Neuro/Psych    GI/Hepatic   Endo/Other    Renal/GU      Musculoskeletal   Abdominal   Peds  Hematology   Anesthesia Other Findings   Reproductive/Obstetrics                             Anesthesia Physical Anesthesia Plan  ASA: II  Anesthesia Plan: General   Post-op Pain Management:    Induction: Intravenous  Airway Management Planned: LMA  Additional Equipment:   Intra-op Plan:   Post-operative Plan:   Informed Consent: I have reviewed the patients History and Physical, chart, labs and discussed the procedure including the risks, benefits and alternatives for the proposed anesthesia with the patient or authorized representative who has indicated his/her understanding and acceptance.     Plan Discussed with: CRNA, Anesthesiologist and Surgeon  Anesthesia Plan Comments:         Anesthesia Quick Evaluation  

## 2014-09-05 NOTE — Anesthesia Postprocedure Evaluation (Signed)
Anesthesia Post Note  Patient: Kyle NordmannShawron Snyder  Procedure(s) Performed: Procedure(s) (LRB): LEFT LONG RING /FOREARM EXPLORATION  WOUNDS (Left) LEFT NERVE, TENDON AND ARTERY REPAIR (Left)  Anesthesia type: General  Patient location: PACU  Post pain: Pain level controlled and Adequate analgesia  Post assessment: Post-op Vital signs reviewed, Patient's Cardiovascular Status Stable, Respiratory Function Stable, Patent Airway and Pain level controlled  Last Vitals:  Filed Vitals:   09/05/14 1545  BP: 135/88  Pulse: 56  Temp:   Resp: 13    Post vital signs: Reviewed and stable  Level of consciousness: awake, alert  and oriented  Complications: No apparent anesthesia complications

## 2014-09-05 NOTE — Anesthesia Procedure Notes (Signed)
Procedure Name: LMA Insertion Date/Time: 09/05/2014 1:21 PM Performed by: Burna CashONRAD, Aniyah Nobis C Pre-anesthesia Checklist: Patient identified, Emergency Drugs available, Suction available and Patient being monitored Patient Re-evaluated:Patient Re-evaluated prior to inductionOxygen Delivery Method: Circle System Utilized Preoxygenation: Pre-oxygenation with 100% oxygen Intubation Type: IV induction Ventilation: Mask ventilation without difficulty LMA: LMA inserted LMA Size: 5.0 Number of attempts: 1 Airway Equipment and Method: Bite block Placement Confirmation: positive ETCO2 Tube secured with: Tape Dental Injury: Teeth and Oropharynx as per pre-operative assessment

## 2014-09-05 NOTE — Transfer of Care (Signed)
Immediate Anesthesia Transfer of Care Note  Patient: Kyle NordmannShawron Snyder  Procedure(s) Performed: Procedure(s): LEFT LONG RING Ocie Bob/FOREARM EXPLORATION  WOUNDS (Left) LEFT NERVE, TENDON AND ARTERY REPAIR (Left)  Patient Location: PACU  Anesthesia Type:General  Level of Consciousness: sedated  Airway & Oxygen Therapy: Patient Spontanous Breathing and Patient connected to face mask oxygen  Post-op Assessment: Report given to RN and Post -op Vital signs reviewed and stable  Post vital signs: Reviewed and stable  Last Vitals:  Filed Vitals:   09/05/14 1141  BP: 130/65  Pulse: 55  Temp: 36.8 C  Resp: 20    Complications: No apparent anesthesia complications

## 2014-09-06 ENCOUNTER — Encounter (HOSPITAL_BASED_OUTPATIENT_CLINIC_OR_DEPARTMENT_OTHER): Payer: Self-pay | Admitting: Orthopedic Surgery

## 2014-09-06 NOTE — Op Note (Signed)
NAMEJOMES, Kyle Snyder NO.:  [id number]  MEDICAL RECORD NO.:  [id number]  LOCATION:                                 FACILITY:  PHYSICIAN:  Betha Loa, MD        DATE OF BIRTH:  27-Apr-1981  DATE OF PROCEDURE:  09/05/2014 DATE OF DISCHARGE:                              OPERATIVE REPORT   PREOPERATIVE DIAGNOSES:  Left forearm laceration, left hand laceration, left ring finger laceration.  POSTOPERATIVE DIAGNOSES:  Left forearm laceration, left ring finger lacerations as well as left long finger FDP, FDS laceration, and ulnar digital nerve laceration.  PROCEDURE:   1. Exploration of left forearm wounds 2. Exploration of left ring finger wound 3. Repair of FDP of long finger in zone 2 4. Repair of FDS of long finger in zone 2 5. Repair of left long finger ulnar digital nerve under microscope.  SURGEON:  Betha Loa, MD.  ASSISTANT:  Cindee Salt, MD.  ANESTHESIA:  General.  IV FLUIDS:  Per anesthesia flow sheet.  ESTIMATED BLOOD LOSS:  Minimal.  COMPLICATIONS:  None.  SPECIMENS:  None.  TOURNIQUET TIME:  85 minutes.  DISPOSITION:  Stable to PACU.  INDICATIONS:  [name] is a 34 year old male who last week lacerated his left hand and forearm while moving furniture and his hand went through a glass paint.  He was seen at [facility] Department where the wounds were irrigated, debrided, and sutured.  He was followed up in the office.  He had difficulty with flexion of the long finger.  I recommended exploration of the wounds with repair of tendon, artery, and nerve as necessary.  Risks, benefits, and alternatives of the surgery were discussed including risk of blood loss, infection; damage to nerves, vessels, tendons, ligaments, bone; failure of surgery; need for additional surgery, complications with wound healing, continued pain, and stiffness.  He voiced understanding of these risks and elected to proceed.  OPERATIVE COURSE:  After  being identified preoperatively by myself, the patient and I agreed upon procedure and site procedure.  Surgical site was marked.  The risks, benefits, and alternatives of surgery were reviewed and he wished to proceed.  Surgical consent had been signed. He was given IV Ancef as preoperative antibiotic prophylaxis.  He was transferred to the operating room and placed on the operating table in supine position with the left upper extremity on arm board.  General anesthesia was induced by anesthesiologist.  The left upper extremity was prepped and draped in normal sterile orthopedic fashion.  Surgical pause was performed between surgeons, anesthesia, operating staff, and all were in agreement as to the patient, procedure, and site of procedure.  All sutures were removed.  The forearm wounds were explored first.  There were 2 wounds.  The fascia was violated at the ulnar-sided wound but the wound did not go deep to the muscle.  The more radial sided wound was smaller the fascia was not violated.  No tendon artery or nerve laceration was noted in the proximal forearm.  The wounds were copiously irrigated with sterile saline and closed with 3-0 nylon in a horizontal mattress fashion.  Attention was turned to the  hand.  There was an U-shaped laceration at the distal palmar flexion crease across the long and ring fingers.  There was an additional wound longitudinally in the ring finger over the proximal middle phalanges with some skin loss.  An area of devitalized skin was removed at the ulnar side of the U-shaped wound in the palm.  The wounds were explored.  The wound in the ring finger itself was explored and the ulnar digital nerve and artery as well as radial digital nerve and artery were identified and were intact. There was no tendon laceration.  The sheath was not violated in the finger itself.  The wound in the hand, the radial and ulnar digital nerve and artery as well as the common  digital nerve and artery were identified to the ring finger and were intact.  There was violation of the sheath between the A1 and A2 pulleys.  The finger was placed through a range of motion and no tendon laceration was noted.  Over the long finger, the sheath was violated at A1.  The A1 pulley was released. There was a complete laceration of the FDS tendon and approximately 75% or greater laceration to the FDP tendon.  The radial digital nerve and artery were identified and were intact.  The ulnar digital artery was intact and the ulnar digital nerve was approximately 50% lacerated.  The wounds were all copiously irrigated with sterile saline.  The FDP and FDS tendons to the long finger were repaired with a 4-0 FiberWire suture in a modified Kessler technique.  An epitenon suture was placed in a running fashion with a 6-0 Prolene suture.  Good apposition of tendon was achieved.  The finger was able to be placed through his range of motion.  The tendon was easily slid into the sheath at A2.  The A1 pulley had been released.  The microscope was brought in and the ulnar digital nerve cleared of soft tissue interposition and clot.  A 9-0 nylon suture was used in an interrupted fashion to repair the nerve ends.  Good apposition was obtained.  The wounds were copiously irrigated with sterile saline again.  They were then closed with 4-0 nylon in a horizontal mattress fashion.  The wound had been extended proximally to aid in visualization and retrieval of the proximal tendon ends.  The wounds were injected with 10 mL of 0.25% plain Marcaine to aid in postoperative analgesia.  They are then dressed with sterile Xeroform, 4x4s, and wrapped with a Kerlix bandage.  A dorsal blocking splint was placed with the MPs flexed and IPs extended and the wrist at approximately 30-40 degrees flexion.  This was wrapped with Kerlix and Ace bandage.  Tourniquet was deflated at 85 minutes.  Fingertips were pink  with brisk capillary refill after deflation of tourniquet. Operative drapes were broken down.  The patient was awoken from anesthesia safely.  He was transferred back to stretcher and taken to PACU in stable condition.  I will see him back in the office in 1 week for postoperative followup.  I will give him Percocet 5/325, 1-2 p.o. q.6 hours p.r.n. pain, dispensed #40.     Betha LoaKevin Jadarious Dobbins, MD     KK/MEDQ  D:  09/05/2014  T:  09/06/2014  Job:  [id number]

## 2015-05-02 ENCOUNTER — Emergency Department (HOSPITAL_COMMUNITY): Payer: Self-pay

## 2015-05-02 ENCOUNTER — Encounter (HOSPITAL_COMMUNITY): Payer: Self-pay

## 2015-05-02 ENCOUNTER — Inpatient Hospital Stay (HOSPITAL_COMMUNITY)
Admission: EM | Admit: 2015-05-02 | Discharge: 2015-05-06 | DRG: 516 | Disposition: A | Discharge status: Home / Self Care | Payer: Self-pay | Attending: Family Medicine | Admitting: Family Medicine

## 2015-05-02 DIAGNOSIS — Y9302 Activity, running: Secondary | ICD-10-CM | POA: Diagnosis present

## 2015-05-02 DIAGNOSIS — F22 Delusional disorders: Secondary | ICD-10-CM | POA: Diagnosis present

## 2015-05-02 DIAGNOSIS — T796XXA Traumatic ischemia of muscle, initial encounter: Secondary | ICD-10-CM

## 2015-05-02 DIAGNOSIS — R441 Visual hallucinations: Secondary | ICD-10-CM

## 2015-05-02 DIAGNOSIS — F23 Brief psychotic disorder: Secondary | ICD-10-CM | POA: Diagnosis present

## 2015-05-02 DIAGNOSIS — S82009A Unspecified fracture of unspecified patella, initial encounter for closed fracture: Secondary | ICD-10-CM

## 2015-05-02 DIAGNOSIS — M542 Cervicalgia: Secondary | ICD-10-CM | POA: Diagnosis present

## 2015-05-02 DIAGNOSIS — Z823 Family history of stroke: Secondary | ICD-10-CM

## 2015-05-02 DIAGNOSIS — M6282 Rhabdomyolysis: Secondary | ICD-10-CM | POA: Diagnosis present

## 2015-05-02 DIAGNOSIS — N179 Acute kidney failure, unspecified: Secondary | ICD-10-CM | POA: Diagnosis present

## 2015-05-02 DIAGNOSIS — E872 Acidosis, unspecified: Secondary | ICD-10-CM

## 2015-05-02 DIAGNOSIS — S82002A Unspecified fracture of left patella, initial encounter for closed fracture: Secondary | ICD-10-CM

## 2015-05-02 DIAGNOSIS — W1789XA Other fall from one level to another, initial encounter: Secondary | ICD-10-CM | POA: Diagnosis present

## 2015-05-02 DIAGNOSIS — F17200 Nicotine dependence, unspecified, uncomplicated: Secondary | ICD-10-CM | POA: Diagnosis present

## 2015-05-02 DIAGNOSIS — S82042A Displaced comminuted fracture of left patella, initial encounter for closed fracture: Principal | ICD-10-CM | POA: Diagnosis present

## 2015-05-02 DIAGNOSIS — Y9289 Other specified places as the place of occurrence of the external cause: Secondary | ICD-10-CM

## 2015-05-02 LAB — RAPID URINE DRUG SCREEN, HOSP PERFORMED
AMPHETAMINES: NOT DETECTED
BARBITURATES: NOT DETECTED
Benzodiazepines: NOT DETECTED
COCAINE: NOT DETECTED
Opiates: NOT DETECTED
TETRAHYDROCANNABINOL: NOT DETECTED

## 2015-05-02 LAB — CBC WITH DIFFERENTIAL/PLATELET
BASOS PCT: 0 %
Basophils Absolute: 0 10*3/uL (ref 0.0–0.1)
EOS ABS: 0 10*3/uL (ref 0.0–0.7)
Eosinophils Relative: 1 %
HCT: 46.2 % (ref 39.0–52.0)
Hemoglobin: 15.4 g/dL (ref 13.0–17.0)
LYMPHS ABS: 2.2 10*3/uL (ref 0.7–4.0)
Lymphocytes Relative: 35 %
MCH: 30.8 pg (ref 26.0–34.0)
MCHC: 33.3 g/dL (ref 30.0–36.0)
MCV: 92.4 fL (ref 78.0–100.0)
MONO ABS: 0.4 10*3/uL (ref 0.1–1.0)
MONOS PCT: 6 %
Neutro Abs: 3.7 10*3/uL (ref 1.7–7.7)
Neutrophils Relative %: 58 %
Platelets: 284 10*3/uL (ref 150–400)
RBC: 5 MIL/uL (ref 4.22–5.81)
RDW: 13 % (ref 11.5–15.5)
WBC: 6.3 10*3/uL (ref 4.0–10.5)

## 2015-05-02 LAB — ETHANOL: Alcohol, Ethyl (B): <5 mg/dL (ref <5)

## 2015-05-02 MED ORDER — OXYCODONE-ACETAMINOPHEN 5-325 MG PO TABS
1.0000 | ORAL_TABLET | Freq: Once | ORAL | Status: AC
  Administered 2015-05-02: 1 via ORAL
  Filled 2015-05-02: qty 1
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
## 2015-05-02 NOTE — ED Notes (Signed)
Patient c/o knee and neck pain.  Denies taking any medications for pain, pain scale 9/10.  NAD at this time.
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
## 2015-05-02 NOTE — ED Notes (Signed)
Blood in lab per Alaina RN prior to leaving her shift
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
## 2015-05-03 ENCOUNTER — Other Ambulatory Visit: Payer: Self-pay | Admitting: Orthopaedic Surgery

## 2015-05-03 ENCOUNTER — Encounter (HOSPITAL_COMMUNITY): Payer: Self-pay | Admitting: Family Medicine

## 2015-05-03 DIAGNOSIS — F29 Unspecified psychosis not due to a substance or known physiological condition: Secondary | ICD-10-CM

## 2015-05-03 DIAGNOSIS — N179 Acute kidney failure, unspecified: Secondary | ICD-10-CM

## 2015-05-03 DIAGNOSIS — F23 Brief psychotic disorder: Secondary | ICD-10-CM | POA: Diagnosis present

## 2015-05-03 DIAGNOSIS — S82002A Unspecified fracture of left patella, initial encounter for closed fracture: Secondary | ICD-10-CM

## 2015-05-03 DIAGNOSIS — S82009A Unspecified fracture of unspecified patella, initial encounter for closed fracture: Secondary | ICD-10-CM | POA: Insufficient documentation

## 2015-05-03 DIAGNOSIS — T796XXA Traumatic ischemia of muscle, initial encounter: Secondary | ICD-10-CM

## 2015-05-03 DIAGNOSIS — M6282 Rhabdomyolysis: Secondary | ICD-10-CM

## 2015-05-03 DIAGNOSIS — R441 Visual hallucinations: Secondary | ICD-10-CM | POA: Insufficient documentation

## 2015-05-03 LAB — RENAL FUNCTION PANEL
ALBUMIN: 3.9 g/dL (ref 3.5–5.0)
ANION GAP: 14 (ref 5–15)
ANION GAP: 9 (ref 5–15)
Albumin: 3.5 g/dL (ref 3.5–5.0)
Albumin: 3.5 g/dL (ref 3.5–5.0)
Albumin: 3.3 g/dL — ABNORMAL LOW (ref 3.5–5.0)
Anion gap: 10 (ref 5–15)
Anion gap: 10 (ref 5–15)
BUN: 15 mg/dL (ref 6–20)
BUN: 15 mg/dL (ref 6–20)
BUN: 14 mg/dL (ref 6–20)
BUN: 13 mg/dL (ref 6–20)
CALCIUM: 8 mg/dL — AB (ref 8.9–10.3)
CALCIUM: 8.1 mg/dL — AB (ref 8.9–10.3)
CHLORIDE: 108 mmol/L (ref 101–111)
CHLORIDE: 107 mmol/L (ref 101–111)
CHLORIDE: 106 mmol/L (ref 101–111)
CO2: 19 mmol/L — AB (ref 22–32)
CO2: 22 mmol/L (ref 22–32)
CO2: 21 mmol/L — AB (ref 22–32)
CO2: 24 mmol/L (ref 22–32)
CREATININE: 1.33 mg/dL — AB (ref 0.61–1.24)
Calcium: 7.9 mg/dL — ABNORMAL LOW (ref 8.9–10.3)
Calcium: 8 mg/dL — ABNORMAL LOW (ref 8.9–10.3)
Chloride: 108 mmol/L (ref 101–111)
Creatinine, Ser: 1.43 mg/dL — ABNORMAL HIGH (ref 0.61–1.24)
Creatinine, Ser: 1.28 mg/dL — ABNORMAL HIGH (ref 0.61–1.24)
Creatinine, Ser: 1.18 mg/dL (ref 0.61–1.24)
GFR calc Af Amer: >60 mL/min (ref >60)
GFR calc Af Amer: >60 mL/min (ref >60)
GFR calc non Af Amer: >60 mL/min (ref >60)
GFR calc non Af Amer: >60 mL/min (ref >60)
GLUCOSE: 79 mg/dL (ref 65–99)
GLUCOSE: 88 mg/dL (ref 65–99)
GLUCOSE: 139 mg/dL — AB (ref 65–99)
Glucose, Bld: 124 mg/dL — ABNORMAL HIGH (ref 65–99)
PHOSPHORUS: 3.9 mg/dL (ref 2.5–4.6)
POTASSIUM: 4 mmol/L (ref 3.5–5.1)
POTASSIUM: 3.5 mmol/L (ref 3.5–5.1)
Phosphorus: 3.4 mg/dL (ref 2.5–4.6)
Phosphorus: 3.7 mg/dL (ref 2.5–4.6)
Phosphorus: 4.3 mg/dL (ref 2.5–4.6)
Potassium: 4.1 mmol/L (ref 3.5–5.1)
Potassium: 4.1 mmol/L (ref 3.5–5.1)
SODIUM: 141 mmol/L (ref 135–145)
Sodium: 140 mmol/L (ref 135–145)
Sodium: 138 mmol/L (ref 135–145)
Sodium: 139 mmol/L (ref 135–145)

## 2015-05-03 LAB — URINALYSIS, DIPSTICK ONLY
BILIRUBIN URINE: NEGATIVE
Bilirubin Urine: NEGATIVE
Bilirubin Urine: NEGATIVE
Bilirubin Urine: NEGATIVE
GLUCOSE, UA: NEGATIVE mg/dL
Glucose, UA: NEGATIVE mg/dL
Glucose, UA: NEGATIVE mg/dL
Glucose, UA: NEGATIVE mg/dL
HGB URINE DIPSTICK: NEGATIVE
Hgb urine dipstick: NEGATIVE
KETONES UR: NEGATIVE mg/dL
Ketones, ur: >80 mg/dL — AB
Ketones, ur: 40 mg/dL — AB
LEUKOCYTES UA: NEGATIVE
LEUKOCYTES UA: NEGATIVE
LEUKOCYTES UA: NEGATIVE
LEUKOCYTES UA: NEGATIVE
NITRITE: NEGATIVE
NITRITE: NEGATIVE
Nitrite: NEGATIVE
Nitrite: NEGATIVE
PROTEIN: NEGATIVE mg/dL
PROTEIN: NEGATIVE mg/dL
PROTEIN: NEGATIVE mg/dL
PROTEIN: NEGATIVE mg/dL
SPECIFIC GRAVITY, URINE: 1.016 (ref 1.005–1.030)
SPECIFIC GRAVITY, URINE: 1.021 (ref 1.005–1.030)
Specific Gravity, Urine: 1.015 (ref 1.005–1.030)
Specific Gravity, Urine: 1.01 (ref 1.005–1.030)
UROBILINOGEN UA: 0.2 mg/dL (ref 0.0–1.0)
UROBILINOGEN UA: 0.2 mg/dL (ref 0.0–1.0)
Urobilinogen, UA: 0.2 mg/dL (ref 0.0–1.0)
Urobilinogen, UA: 1 mg/dL (ref 0.0–1.0)
pH: 5 (ref 5.0–8.0)
pH: 5.5 (ref 5.0–8.0)
pH: 6 (ref 5.0–8.0)
pH: 6 (ref 5.0–8.0)

## 2015-05-03 LAB — BASIC METABOLIC PANEL
ANION GAP: 13 (ref 5–15)
Anion gap: 28 — ABNORMAL HIGH (ref 5–15)
Anion gap: 9 (ref 5–15)
BUN: 18 mg/dL (ref 6–20)
BUN: 16 mg/dL (ref 6–20)
BUN: 15 mg/dL (ref 6–20)
CALCIUM: 9.5 mg/dL (ref 8.9–10.3)
CALCIUM: 8.1 mg/dL — AB (ref 8.9–10.3)
CO2: 11 mmol/L — ABNORMAL LOW (ref 22–32)
CO2: 20 mmol/L — AB (ref 22–32)
CO2: 23 mmol/L (ref 22–32)
CREATININE: 1.97 mg/dL — AB (ref 0.61–1.24)
Calcium: 8.4 mg/dL — ABNORMAL LOW (ref 8.9–10.3)
Chloride: 101 mmol/L (ref 101–111)
Chloride: 104 mmol/L (ref 101–111)
Chloride: 106 mmol/L (ref 101–111)
Creatinine, Ser: 1.59 mg/dL — ABNORMAL HIGH (ref 0.61–1.24)
Creatinine, Ser: 1.25 mg/dL — ABNORMAL HIGH (ref 0.61–1.24)
GFR calc Af Amer: 49 mL/min — ABNORMAL LOW (ref >60)
GFR calc Af Amer: >60 mL/min (ref >60)
GFR calc Af Amer: >60 mL/min (ref >60)
GFR, EST NON AFRICAN AMERICAN: 42 mL/min — AB (ref >60)
GFR, EST NON AFRICAN AMERICAN: 55 mL/min — AB (ref >60)
GLUCOSE: 119 mg/dL — AB (ref 65–99)
GLUCOSE: 81 mg/dL (ref 65–99)
GLUCOSE: 92 mg/dL (ref 65–99)
POTASSIUM: 3.6 mmol/L (ref 3.5–5.1)
POTASSIUM: 4.1 mmol/L (ref 3.5–5.1)
Potassium: 4.9 mmol/L (ref 3.5–5.1)
SODIUM: 138 mmol/L (ref 135–145)
Sodium: 140 mmol/L (ref 135–145)
Sodium: 137 mmol/L (ref 135–145)

## 2015-05-03 LAB — SALICYLATE LEVEL
Salicylate Lvl: <4 mg/dL (ref 2.8–30.0)
Salicylate Lvl: <4 mg/dL (ref 2.8–30.0)

## 2015-05-03 LAB — CK
CK TOTAL: 7664 U/L — AB (ref 49–397)
CK TOTAL: 29593 U/L — AB (ref 49–397)
CK TOTAL: 28247 U/L — AB (ref 49–397)
CK TOTAL: 22260 U/L — AB (ref 49–397)
Total CK: 21242 U/L — ABNORMAL HIGH (ref 49–397)

## 2015-05-03 LAB — ACETAMINOPHEN LEVEL
Acetaminophen (Tylenol), Serum: <10 ug/mL — ABNORMAL LOW (ref 10–30)
Acetaminophen (Tylenol), Serum: <10 ug/mL — ABNORMAL LOW (ref 10–30)

## 2015-05-03 LAB — ETHANOL

## 2015-05-03 LAB — I-STAT CG4 LACTIC ACID, ED: LACTIC ACID, VENOUS: 1.08 mmol/L (ref 0.5–2.0)

## 2015-05-03 MED ORDER — SODIUM CHLORIDE 0.9 % IV BOLUS (SEPSIS)
1000.0000 mL | Freq: Once | INTRAVENOUS | Status: AC
  Administered 2015-05-03: 1000 mL via INTRAVENOUS

## 2015-05-03 MED ORDER — ACETAMINOPHEN 650 MG RE SUPP: 650.0000 mg | Freq: Four times a day (QID) | RECTAL | Status: DC | PRN

## 2015-05-03 MED ORDER — SODIUM CHLORIDE 0.9 % IV SOLN
INTRAVENOUS | Status: DC
  Administered 2015-05-03 – 2015-05-05 (×7): via INTRAVENOUS

## 2015-05-03 MED ORDER — ACETAMINOPHEN 325 MG PO TABS: 650.0000 mg | ORAL_TABLET | Freq: Four times a day (QID) | ORAL | Status: DC | PRN

## 2015-05-03 MED ORDER — CALCIUM CITRATE 950 (200 CA) MG PO TABS: 200.0000 mg | ORAL_TABLET | Freq: Three times a day (TID) | ORAL | Status: DC

## 2015-05-03 MED ORDER — SODIUM BICARBONATE 8.4 % IV SOLN
INTRAVENOUS | Status: DC
  Administered 2015-05-03 – 2015-05-05 (×4): via INTRAVENOUS
  Filled 2015-05-03 (×5): qty 150

## 2015-05-03 MED ORDER — SODIUM CHLORIDE 0.9 % IV SOLN: INTRAVENOUS | Status: DC

## 2015-05-03 MED ORDER — OXYCODONE-ACETAMINOPHEN 5-325 MG PO TABS
1.0000 | ORAL_TABLET | Freq: Four times a day (QID) | ORAL | Status: DC | PRN
  Administered 2015-05-03 – 2015-05-04 (×4): 1 via ORAL
  Filled 2015-05-03 (×4): qty 1
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
## 2015-05-03 NOTE — Progress Notes (Signed)
10:19 AM I agree with HPI/GPe and A/P per Dr. Maryfrances Bunnellanford   34 y/o ? Prior h/o Hand laceration Admitted with paranoid psychotic behavior and delirious He was noted to have a gun next is mild EtOH but no IVC by psychiatry CK total 7664 noted a AKI  Having some discomfort and pain Frustrated about lab draws No other issues      HEENT EOMI NCAT, groin and pleasant not confused.  Can tell me the date time month and year CHEST clear no added sound CARDIAC S1-S2 no murmur rub or gallop slightly tachycardic ABDOMEN soft nontender nondistended no rebound NEURO able to flex and extend at the ankle but limited by pain SKIN/MUSCULAR  Patient Active Problem List   Diagnosis Date Noted  . Rhabdomyolysis 05/03/2015  . AKI (acute kidney injury) (HCC) 05/03/2015  . Acute psychosis 05/03/2015   Consulted Dr. Joneen Boersahlldorf of orthopedics to look in on the patient's knee and make recommendations Psychiatry to discontinue IVC-I do not think patient was psychotic but it didn't make sure Change in 7 to every 4 hours asbasic metabolic panels are improving although he still remains hypocalcemic  Follow CK from 12 PM  Making urine still

## 2015-05-03 NOTE — BH Assessment (Signed)
Assessment completed. Consulted Donell SievertSpencer Simon, PA-C who recommended an am psych eval. Informed Melburn HakeNicole Nadeau, PA-C of recommendation.

## 2015-05-03 NOTE — ED Notes (Signed)
MD at bedside. 

## 2015-05-03 NOTE — BH Assessment (Addendum)
Tele Assessment Note   Kyle Snyder is an 34 y.o. male presenting to [facility] due to an abrasion to his leg. PT stated "I am not mentally ill". "I go to [facility] classes and they ask the same questions". Pt denies SI, HI and AVH at this time' however he appears very paranoid. Pt reported that there are janitors walking around the hospital with stethoscope and holding a black whip while seeing patients. When this writer knocked on pt's door he answered the door with his crutches in his hand inquiring why this writer did not have on scrubs. It has been documented that pt has been hiding behind the door because females were trying to get him. Pt did not report any alcohol or drug use but reported that he has pending charges for selling drugs. PT did not report any mental health treatment or psychiatric hospitalizations.    AM Psych eval is recommended for final disposition.   Diagnosis: Paranoia   Past Medical History:  Past Medical History  Diagnosis Date  . Heart murmur     diagnosed as a child, never had any problems, per pt. and mother  . Laceration of forearm, left 09/01/2014  . Finger laceration involving tendon 09/01/2014    left long and ring fingers    Past Surgical History  Procedure Laterality Date  . No past surgeries    . Wound exploration Left 09/05/2014    Procedure: LEFT LONG RING Ocie Bob EXPLORATION  WOUNDS;  Surgeon: Kyle Loa, MD;  Location: [facility];  Service: Orthopedics;  Laterality: Left;  . Nerve, tendon and artery repair Left 09/05/2014    Procedure: LEFT NERVE, TENDON AND ARTERY REPAIR;  Surgeon: Kyle Loa, MD;  Location: [facility];  Service: Orthopedics;  Laterality: Left;    Family History: No family history on file.  Social History:  reports that he has quit smoking. His smoking use included Cigars. He has never used smokeless tobacco. He reports that he drinks alcohol. He reports that he does not use illicit drugs.  Additional  Social History:  Alcohol / Drug Use History of alcohol / drug use?: No history of alcohol / drug abuse  CIWA: CIWA-Ar BP: 127/64 mmHg Pulse Rate: (!) 144 COWS:    PATIENT STRENGTHS: (choose at least two) Average or above average intelligence  Allergies: No Known Allergies  Home Medications:  (Not in a hospital admission)  OB/GYN Status:  No LMP for male patient.  General Assessment Data Location of Assessment: [facility] TTS Assessment: In system Is this a Tele or Face-to-Face Assessment?: Face-to-Face Is this an Initial Assessment or a Re-assessment for this encounter?: Initial Assessment Marital status: Single Living Arrangements: Spouse/significant other Can pt return to current living arrangement?: Yes Admission Status: Voluntary Is patient capable of signing voluntary admission?: Yes Referral Source: Self/Family/Friend Insurance type: None      Crisis Care Plan Living Arrangements: Spouse/significant other Name of Psychiatrist: None  Name of Therapist: None   Education Status Is patient currently in school?: No Current Grade: N/A Highest grade of school patient has completed: N/A Name of school: N/A Contact person: N/A  Risk to self with the past 6 months Suicidal Ideation: No Has patient been a risk to self within the past 6 months prior to admission? : No Suicidal Intent: No Has patient had any suicidal intent within the past 6 months prior to admission? : No Is patient at risk for suicide?: No Suicidal Plan?: No Has patient had any suicidal  plan within the past 6 months prior to admission? : No Access to Means: No What has been your use of drugs/alcohol within the last 12 months?: Pt denies alcohol and drug use.  Previous Attempts/Gestures: No How many times?: 0 Other Self Harm Risks: No other self harm risk identified.  Triggers for Past Attempts: None known Intentional Self Injurious Behavior: None Family Suicide History: No Recent stressful life  event(s):  (None reported. ) Persecutory voices/beliefs?: No Depression: No Depression Symptoms:  (Pt denies symptoms ) Substance abuse history and/or treatment for substance abuse?: No  Risk to Others within the past 6 months Homicidal Ideation: No Does patient have any lifetime risk of violence toward others beyond the six months prior to admission? : No Thoughts of Harm to Others: No Current Homicidal Intent: No Current Homicidal Plan: No Access to Homicidal Means: No Identified Victim: N/A History of harm to others?: No Assessment of Violence: On admission Violent Behavior Description: No violent behaviors observed.  Does patient have access to weapons?: No Criminal Charges Pending?: Yes Describe Pending Criminal Charges: "selling drugs"  Does patient have a court date: Yes Court Date:  (11/16) Is patient on probation?: Unknown  Psychosis Hallucinations: None noted Delusions:  (Paranoid )  Mental Status Report Appearance/Hygiene: Unremarkable Eye Contact: Fair Motor Activity: Freedom of movement Speech: Logical/coherent Level of Consciousness: Quiet/awake Mood: Irritable Affect: Irritable Anxiety Level: Moderate Thought Processes: Relevant Judgement: Unimpaired Orientation: Appropriate for developmental age Obsessive Compulsive Thoughts/Behaviors: None  Cognitive Functioning Concentration: Fair Memory: Recent Intact IQ: Average Insight: Fair Impulse Control: Fair Appetite: Good Weight Loss: 0 Weight Gain: 0 Sleep: No Change Total Hours of Sleep: 8 Vegetative Symptoms: None  ADLScreening [facility] Assessment Services) Patient's cognitive ability adequate to safely complete daily activities?: Yes Patient able to express need for assistance with ADLs?: Yes Independently performs ADLs?: Yes (appropriate for developmental age)  Prior Inpatient Therapy Prior Inpatient Therapy: No  Prior Outpatient Therapy Prior Outpatient Therapy: No Does patient have an ACCT  team?: No Does patient have Intensive In-House Services?  : No Does patient have [facility] services? : No Does patient have P4CC services?: No  ADL Screening (condition at time of admission) Patient's cognitive ability adequate to safely complete daily activities?: Yes Is the patient deaf or have difficulty hearing?: No Does the patient have difficulty seeing, even when wearing glasses/contacts?: No Does the patient have difficulty concentrating, remembering, or making decisions?: No Patient able to express need for assistance with ADLs?: Yes Does the patient have difficulty dressing or bathing?: No Independently performs ADLs?: Yes (appropriate for developmental age)       Abuse/Neglect Assessment (Assessment to be complete while patient is alone) Physical Abuse: Denies Verbal Abuse: Denies Sexual Abuse: Denies Exploitation of patient/patient's resources: Denies Self-Neglect: Denies     Merchant navy officerAdvance Directives (For Healthcare) Does patient have an advance directive?: No    Additional Information 1:1 In Past 12 Months?: No CIRT Risk: No Elopement Risk: No Does patient have medical clearance?: Yes     Disposition:  Disposition Initial Assessment Completed for this Encounter: Yes  Heather Mckendree S 05/03/2015 12:21 AM

## 2015-05-03 NOTE — Consult Note (Signed)
Marcene Corning, MD  Bryna Colander, PA-C  Elodia Florence, PA-C                                  [facility]                [address], [place], [place]  [address]   ORTHOPAEDIC CONSULTATION  Kyle Snyder            MRN:  [id number] DOB/SEX:  1980-08-09/male     CHIEF COMPLAINT:  Painful left knee  HISTORY: Kyle Parkeris a 34 y.o. male with patella fracture.  Was running yesterday and stepped in a hole and landed on his left knee.  Had terrible pain but somehow kept running.  Was admitted recently with rhabdomyolysis to medical team.  ORS consulted about patella fracture.     PAST MEDICAL HISTORY: Patient Active Problem List   Diagnosis Date Noted  . Rhabdomyolysis 05/03/2015  . AKI (acute kidney injury) (HCC) 05/03/2015  . Acute psychosis 05/03/2015  . Hallucinations, visual   . Patellar fracture    Past Medical History  Diagnosis Date  . Prematurity 09/01/2014  . Finger laceration involving tendon 09/01/2014    left long and ring fingers   Past Surgical History  Procedure Laterality Date  . Wound exploration Left 09/05/2014    Procedure: LEFT LONG RING Ocie Bob EXPLORATION  WOUNDS;  Surgeon: Betha Loa, MD;  Location: [facility];  Service: Orthopedics;  Laterality: Left;  . Nerve, tendon and artery repair Left 09/05/2014    Procedure: LEFT NERVE, TENDON AND ARTERY REPAIR;  Surgeon: Betha Loa, MD;  Location: [facility];  Service: Orthopedics;  Laterality: Left;     MEDICATIONS:   Current facility-administered medications:  .  0.9 %  sodium chloride infusion, , Intravenous, Continuous, Alberteen Sam, MD, Last Rate: 200 mL/hr at 05/03/15 0710 .  0.9 %  sodium chloride infusion, , Intravenous, Continuous, Alberteen Sam, MD .  acetaminophen (TYLENOL) tablet 650 mg, 650 mg, Oral, Q6H PRN **OR** acetaminophen (TYLENOL) suppository 650 mg, 650 mg, Rectal, Q6H PRN, Alberteen Sam, MD .  oxyCODONE-acetaminophen  (PERCOCET/ROXICET) 5-325 MG per tablet 1 tablet, 1 tablet, Oral, Q6H PRN, Alberteen Sam, MD, 1 tablet at 05/03/15 1139 .  sodium bicarbonate 150 mEq in dextrose 5 % 1,000 mL infusion, , Intravenous, Continuous, Alberteen Sam, MD, Last Rate: 100 mL/hr at 05/03/15 0944  ALLERGIES:  No Known Allergies  REVIEW OF SYSTEMS: REVIEWED IN DETAIL IN CHART  FAMILY HISTORY:   Family History  Problem Relation Age of Onset  . Stroke Mother   . Kidney disease Mother     During pregnancy    SOCIAL HISTORY:   Social History  Substance Use Topics  . Smoking status: Current Every Day Smoker -- 10 years    Types: Cigars  . Smokeless tobacco: Never Used     Comment: 1/2 of a Black and Mild/day  . Alcohol Use: 0.0 oz/week    0 Standard drinks or equivalent per week     Comment: occasionally     EXAMINATION: Vital signs in last 24 hours: Temp:  [98.4 F (36.9 C)-98.5 F (36.9 C)] 98.4 F (36.9 C) (11/09 0634) Pulse Rate:  [76-152] 90 (11/09 0634) Resp:  [16-23] 18 (11/09 0634) BP: (122-154)/(58-96) 154/80 mmHg (11/09 0634) SpO2:  [96 %-100 %] 100 % (11/09 0634) Weight:  [133 lb 13.1 oz (60.7  kg)-158 lb (71.668 kg)] 133 lb 13.1 oz (60.7 kg) (11/09 0634)  BP 154/80 mmHg  Pulse 90  Temp(Src) 98.4 F (36.9 C) (Oral)  Resp 18  Ht  (1.753 m)  Wt 133 lb 13.1 oz (60.7 kg)  BMI 19.75 kg/m2  SpO2 100%  General Appearance:    Alert, cooperative, no distress, appears stated age  Head:    Normocephalic, without obvious abnormality, atraumatic  Eyes:    PERRL, conjunctiva/corneas clear, EOM's intact, fundi    benign, both eyes       Ears:    Normal TM's and external ear canals, both ears  Nose:   Nares normal, septum midline, mucosa normal, no drainage    or sinus tenderness  Throat:   Lips, mucosa, and tongue normal; teeth and gums normal  Neck:   Supple, symmetrical, trachea midline, no adenopathy;       thyroid:  No enlargement/tenderness/nodules; no carotid   bruit or  JVD  Back:     Symmetric, no curvature, ROM normal, no CVA tenderness  Lungs:     Clear to auscultation bilaterally, respirations unlabored  Chest wall:    No tenderness or deformity  Heart:    Regular rate and rhythm, S1 and S2 normal, no murmur, rub   or gallop  Abdomen:     Soft, non-tender, bowel sounds active all four quadrants,    no masses, no organomegaly  Genitalia:    Rectal:    Extremities:   Extremities normal, atraumatic, no cyanosis or edema  Pulses:   2+ and symmetric all extremities  Skin:   Skin color, texture, turgor normal, no rashes or lesions  Lymph nodes:   Cervical, supraclavicular, and axillary nodes normal  Neurologic:   CNII-XII intact. Normal strength, sensation and reflexes      throughout    Musculoskeletal Exam:   Left knee skin benign, 3+ effusion, cannot do active SLR   DIAGNOSTIC STUDIES: Recent laboratory studies:  Recent Labs  05/02/15 1920  WBC 6.3  HGB 15.4  HCT 46.2  PLT 284    Recent Labs  05/02/15 1920 05/03/15 0202 05/03/15 0820 05/03/15 1010  NA 140 137 141 140  K 4.9 3.6 4.1 4.0  CL 101 104 108 108  CO2 11* 20* 19* 22  BUN CREATININE 1.97* 1.59* 1.43* 1.33*  GLUCOSE 119* 81 79 124*  CALCIUM 9.5 8.4* 8.0* 7.9*   No results found for: INR, PROTIME   Recent Radiographic Studies :  Dg Cervical Spine Complete  05/02/2015  CLINICAL DATA:  Patient running through the woods. Larey Seat over a fence. Initial encounter. EXAM: CERVICAL SPINE - COMPLETE 4+ VIEW COMPARISON:  None. FINDINGS: Visualization through the T1 vertebral body on lateral view. Normal anatomic alignment. Preservation the vertebral body and intervertebral disc space heights. Prevertebral soft tissues are unremarkable. No evidence for acute displaced cervical spine fracture. Lateral masses articulate appropriately with the dens. Lung apices are unremarkable. IMPRESSION: No acute cervical spine fracture. Electronically Signed   By: Annia Belt M.D.   On:  05/02/2015 21:39   Dg Knee Complete 4 Views Left  05/02/2015  CLINICAL DATA:  Fall landing on left knee, now with left knee pain. EXAM: LEFT KNEE - COMPLETE 4+ VIEW COMPARISON:  Radiographs 11/29/2009 FINDINGS: Distracted comminuted mid patellar fracture with 1.8 cm osseous distraction through the mid patellar pole. Associated anterior soft tissue edema. No additional acute fracture. There is likely a joint effusion. IMPRESSION: Comminuted patellar fracture  through the midportion with osseous distraction of 1.8 cm. Electronically Signed   By: Rubye OaksMelanie  Ehinger M.D.   On: 05/02/2015 21:37   Dg Knee Complete 4 Views Right  05/02/2015  CLINICAL DATA:  Right knee pain after fall. EXAM: RIGHT KNEE - COMPLETE 4+ VIEW COMPARISON:  None. FINDINGS: No fracture or dislocation. The alignment and joint spaces are maintained. No joint effusion. No soft tissue abnormality or radiopaque foreign body. IMPRESSION: Negative. Electronically Signed   By: Rubye OaksMelanie  Ehinger M.D.   On: 05/02/2015 21:38    ASSESSMENT:  Left patella fracture   PLAN:  Has displaced patella fracture and consequent loss of quadriceps function.  Needs ORIF in hopes of reestablishing this connection allowing him to walk and run again.  Reviewed risks of infection and nonunion in detail.  Reviewed knee brace postop for several months.  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  Will plan on surgery late tomorrow afternoon but this will depend on medical clearance related to his rhabdo.  Niall Illes G 05/03/2015, 12:05 PM
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
## 2015-05-04 ENCOUNTER — Inpatient Hospital Stay (HOSPITAL_COMMUNITY): Payer: Self-pay

## 2015-05-04 ENCOUNTER — Encounter (HOSPITAL_COMMUNITY)
Admission: EM | Disposition: A | Discharge status: Home / Self Care | Payer: Self-pay | Source: Home / Self Care | Attending: Family Medicine

## 2015-05-04 ENCOUNTER — Inpatient Hospital Stay (HOSPITAL_COMMUNITY): Payer: MEDICAID | Admitting: Anesthesiology

## 2015-05-04 ENCOUNTER — Encounter (HOSPITAL_COMMUNITY): Payer: Self-pay | Admitting: Anesthesiology

## 2015-05-04 ENCOUNTER — Inpatient Hospital Stay (HOSPITAL_COMMUNITY): Payer: Self-pay | Admitting: Anesthesiology

## 2015-05-04 HISTORY — PX: ORIF PATELLA: SHX5033

## 2015-05-04 LAB — URINALYSIS, DIPSTICK ONLY
BILIRUBIN URINE: NEGATIVE
BILIRUBIN URINE: NEGATIVE
Bilirubin Urine: NEGATIVE
GLUCOSE, UA: NEGATIVE mg/dL
Glucose, UA: NEGATIVE mg/dL
Glucose, UA: NEGATIVE mg/dL
HGB URINE DIPSTICK: NEGATIVE
Hgb urine dipstick: NEGATIVE
Hgb urine dipstick: NEGATIVE
KETONES UR: NEGATIVE mg/dL
Ketones, ur: NEGATIVE mg/dL
Ketones, ur: NEGATIVE mg/dL
LEUKOCYTES UA: NEGATIVE
Leukocytes, UA: NEGATIVE
Leukocytes, UA: NEGATIVE
NITRITE: NEGATIVE
Nitrite: NEGATIVE
Nitrite: NEGATIVE
PH: 6.5 (ref 5.0–8.0)
PH: 7 (ref 5.0–8.0)
PH: 8 (ref 5.0–8.0)
Protein, ur: NEGATIVE mg/dL
Protein, ur: NEGATIVE mg/dL
Protein, ur: NEGATIVE mg/dL
SPECIFIC GRAVITY, URINE: 1.005 (ref 1.005–1.030)
SPECIFIC GRAVITY, URINE: 1.012 (ref 1.005–1.030)
SPECIFIC GRAVITY, URINE: 1.007 (ref 1.005–1.030)
Urobilinogen, UA: 0.2 mg/dL (ref 0.0–1.0)
Urobilinogen, UA: 1 mg/dL (ref 0.0–1.0)
Urobilinogen, UA: 1 mg/dL (ref 0.0–1.0)

## 2015-05-04 LAB — URINALYSIS, ROUTINE W REFLEX MICROSCOPIC
BILIRUBIN URINE: NEGATIVE
Glucose, UA: NEGATIVE mg/dL
Hgb urine dipstick: NEGATIVE
Ketones, ur: NEGATIVE mg/dL
LEUKOCYTES UA: NEGATIVE
NITRITE: NEGATIVE
Protein, ur: NEGATIVE mg/dL
SPECIFIC GRAVITY, URINE: 1.011 (ref 1.005–1.030)
UROBILINOGEN UA: 1 mg/dL (ref 0.0–1.0)
pH: 7.5 (ref 5.0–8.0)

## 2015-05-04 LAB — BASIC METABOLIC PANEL
ANION GAP: 6 (ref 5–15)
ANION GAP: 11 (ref 5–15)
BUN: <5 mg/dL — ABNORMAL LOW (ref 6–20)
BUN: 6 mg/dL (ref 6–20)
CALCIUM: 7.9 mg/dL — AB (ref 8.9–10.3)
CALCIUM: 8.8 mg/dL — AB (ref 8.9–10.3)
CHLORIDE: 104 mmol/L (ref 101–111)
CO2: 29 mmol/L (ref 22–32)
CO2: 26 mmol/L (ref 22–32)
CREATININE: 0.9 mg/dL (ref 0.61–1.24)
CREATININE: 1.12 mg/dL (ref 0.61–1.24)
Chloride: 104 mmol/L (ref 101–111)
GFR calc non Af Amer: >60 mL/min (ref >60)
GFR calc non Af Amer: >60 mL/min (ref >60)
Glucose, Bld: 113 mg/dL — ABNORMAL HIGH (ref 65–99)
Glucose, Bld: 233 mg/dL — ABNORMAL HIGH (ref 65–99)
Potassium: 3.3 mmol/L — ABNORMAL LOW (ref 3.5–5.1)
Potassium: 4.2 mmol/L (ref 3.5–5.1)
SODIUM: 139 mmol/L (ref 135–145)
SODIUM: 141 mmol/L (ref 135–145)

## 2015-05-04 LAB — SURGICAL PCR SCREEN
MRSA, PCR: NEGATIVE
Staphylococcus aureus: NEGATIVE

## 2015-05-04 LAB — CK
CK TOTAL: 16476 U/L — AB (ref 49–397)
Total CK: 21095 U/L — ABNORMAL HIGH (ref 49–397)
Total CK: 18858 U/L — ABNORMAL HIGH (ref 49–397)
Total CK: 15308 U/L — ABNORMAL HIGH (ref 49–397)

## 2015-05-04 SURGERY — OPEN REDUCTION INTERNAL FIXATION (ORIF) PATELLA
Anesthesia: General | Site: Knee | Laterality: Left

## 2015-05-04 MED ORDER — PROMETHAZINE HCL 25 MG/ML IJ SOLN: 6.2500 mg | INTRAMUSCULAR | Status: DC | PRN

## 2015-05-04 MED ORDER — SUGAMMADEX SODIUM 200 MG/2ML IV SOLN
INTRAVENOUS | Status: AC
  Filled 2015-05-04: qty 2

## 2015-05-04 MED ORDER — MIDAZOLAM HCL 2 MG/2ML IJ SOLN
INTRAMUSCULAR | Status: AC
  Filled 2015-05-04: qty 4

## 2015-05-04 MED ORDER — PROPOFOL 10 MG/ML IV BOLUS
INTRAVENOUS | Status: DC | PRN
  Administered 2015-05-04: 120 mg via INTRAVENOUS

## 2015-05-04 MED ORDER — SODIUM CHLORIDE 0.9 % IJ SOLN
INTRAMUSCULAR | Status: AC
  Filled 2015-05-04: qty 10

## 2015-05-04 MED ORDER — PROPOFOL 10 MG/ML IV BOLUS
INTRAVENOUS | Status: AC
  Filled 2015-05-04: qty 20

## 2015-05-04 MED ORDER — MIDAZOLAM HCL 2 MG/2ML IJ SOLN
INTRAMUSCULAR | Status: DC | PRN
  Administered 2015-05-04: 2 mg via INTRAVENOUS

## 2015-05-04 MED ORDER — CEFAZOLIN SODIUM-DEXTROSE 2-3 GM-% IV SOLR
2.0000 g | Freq: Four times a day (QID) | INTRAVENOUS | Status: AC
  Administered 2015-05-04: 2 g via INTRAVENOUS
  Filled 2015-05-04: qty 50

## 2015-05-04 MED ORDER — METOCLOPRAMIDE HCL 10 MG PO TABS: 5.0000 mg | ORAL_TABLET | Freq: Three times a day (TID) | ORAL | Status: DC | PRN

## 2015-05-04 MED ORDER — METHOCARBAMOL 1000 MG/10ML IJ SOLN
500.0000 mg | Freq: Four times a day (QID) | INTRAVENOUS | Status: DC | PRN
  Filled 2015-05-04: qty 5

## 2015-05-04 MED ORDER — BUPIVACAINE LIPOSOME 1.3 % IJ SUSP
INTRAMUSCULAR | Status: DC | PRN
  Administered 2015-05-04: 10 mL

## 2015-05-04 MED ORDER — FENTANYL CITRATE (PF) 100 MCG/2ML IJ SOLN: 25.0000 ug | INTRAMUSCULAR | Status: DC | PRN

## 2015-05-04 MED ORDER — ONDANSETRON HCL 4 MG/2ML IJ SOLN
INTRAMUSCULAR | Status: AC
  Filled 2015-05-04: qty 2

## 2015-05-04 MED ORDER — METHOCARBAMOL 500 MG PO TABS
500.0000 mg | ORAL_TABLET | Freq: Four times a day (QID) | ORAL | Status: DC | PRN
  Administered 2015-05-04 – 2015-05-05 (×2): 500 mg via ORAL
  Filled 2015-05-04 (×2): qty 1

## 2015-05-04 MED ORDER — LIDOCAINE HCL (CARDIAC) 20 MG/ML IV SOLN
INTRAVENOUS | Status: DC | PRN
  Administered 2015-05-04: 75 mg via INTRAVENOUS

## 2015-05-04 MED ORDER — BUPIVACAINE-EPINEPHRINE (PF) 0.5% -1:200000 IJ SOLN
INTRAMUSCULAR | Status: DC | PRN
  Administered 2015-05-04: 10 mL

## 2015-05-04 MED ORDER — ROCURONIUM BROMIDE 100 MG/10ML IV SOLN
INTRAVENOUS | Status: DC | PRN
  Administered 2015-05-04: 30 mg via INTRAVENOUS

## 2015-05-04 MED ORDER — LIDOCAINE HCL (CARDIAC) 20 MG/ML IV SOLN
INTRAVENOUS | Status: AC
  Filled 2015-05-04: qty 5

## 2015-05-04 MED ORDER — DEXMEDETOMIDINE HCL IN NACL 200 MCG/50ML IV SOLN
INTRAVENOUS | Status: DC | PRN
  Administered 2015-05-04 (×2): 8 ug via INTRAVENOUS
  Administered 2015-05-04: 4 ug via INTRAVENOUS
  Administered 2015-05-04: 6 ug via INTRAVENOUS

## 2015-05-04 MED ORDER — SUFENTANIL CITRATE 50 MCG/ML IV SOLN
INTRAVENOUS | Status: DC | PRN
  Administered 2015-05-04: 15 ug via INTRAVENOUS
  Administered 2015-05-04: 5 ug via INTRAVENOUS
  Administered 2015-05-04 (×2): 10 ug via INTRAVENOUS

## 2015-05-04 MED ORDER — EPHEDRINE SULFATE 50 MG/ML IJ SOLN
INTRAMUSCULAR | Status: DC | PRN
  Administered 2015-05-04: 10 mg via INTRAVENOUS

## 2015-05-04 MED ORDER — SUGAMMADEX SODIUM 200 MG/2ML IV SOLN
INTRAVENOUS | Status: DC | PRN
  Administered 2015-05-04: 120 mg via INTRAVENOUS

## 2015-05-04 MED ORDER — DEXAMETHASONE SODIUM PHOSPHATE 10 MG/ML IJ SOLN
INTRAMUSCULAR | Status: DC | PRN
  Administered 2015-05-04: 10 mg via INTRAVENOUS

## 2015-05-04 MED ORDER — ACETAMINOPHEN 650 MG RE SUPP: 650.0000 mg | Freq: Four times a day (QID) | RECTAL | Status: DC | PRN

## 2015-05-04 MED ORDER — ONDANSETRON HCL 4 MG PO TABS: 4.0000 mg | ORAL_TABLET | Freq: Four times a day (QID) | ORAL | Status: DC | PRN

## 2015-05-04 MED ORDER — PHENYLEPHRINE HCL 10 MG/ML IJ SOLN
INTRAMUSCULAR | Status: DC | PRN
  Administered 2015-05-04 (×2): 40 ug via INTRAVENOUS

## 2015-05-04 MED ORDER — ACETAMINOPHEN 10 MG/ML IV SOLN
INTRAVENOUS | Status: DC | PRN
  Administered 2015-05-04: 1000 mg via INTRAVENOUS

## 2015-05-04 MED ORDER — 0.9 % SODIUM CHLORIDE (POUR BTL) OPTIME
TOPICAL | Status: DC | PRN
  Administered 2015-05-04: 1000 mL

## 2015-05-04 MED ORDER — CEFAZOLIN SODIUM-DEXTROSE 2-3 GM-% IV SOLR
2.0000 g | INTRAVENOUS | Status: AC
  Administered 2015-05-04: 2 g via INTRAVENOUS

## 2015-05-04 MED ORDER — PHENYLEPHRINE 40 MCG/ML (10ML) SYRINGE FOR IV PUSH (FOR BLOOD PRESSURE SUPPORT)
PREFILLED_SYRINGE | INTRAVENOUS | Status: AC
Start: 2015-05-04 — End: 2015-05-04
  Filled 2015-05-04: qty 10

## 2015-05-04 MED ORDER — LACTATED RINGERS IV SOLN
INTRAVENOUS | Status: DC
  Administered 2015-05-04: 1000 mL via INTRAVENOUS
  Administered 2015-05-04: 18:00:00 via INTRAVENOUS

## 2015-05-04 MED ORDER — ACETAMINOPHEN 325 MG PO TABS: 650.0000 mg | ORAL_TABLET | Freq: Four times a day (QID) | ORAL | Status: DC | PRN

## 2015-05-04 MED ORDER — BUPIVACAINE HCL (PF) 0.5 % IJ SOLN
INTRAMUSCULAR | Status: AC
  Filled 2015-05-04: qty 30

## 2015-05-04 MED ORDER — ONDANSETRON HCL 4 MG/2ML IJ SOLN: 4.0000 mg | Freq: Four times a day (QID) | INTRAMUSCULAR | Status: DC | PRN

## 2015-05-04 MED ORDER — DEXMEDETOMIDINE BOLUS VIA INFUSION
0.5000 ug/kg | Freq: Once | INTRAVENOUS | Status: AC
  Filled 2015-05-04 (×2): qty 31

## 2015-05-04 MED ORDER — OXYCODONE-ACETAMINOPHEN 5-325 MG PO TABS
1.0000 | ORAL_TABLET | Freq: Four times a day (QID) | ORAL | Status: DC | PRN
  Administered 2015-05-04: 1 via ORAL
  Administered 2015-05-05 (×2): 2 via ORAL
  Filled 2015-05-04 (×2): qty 2
  Filled 2015-05-04: qty 1

## 2015-05-04 MED ORDER — METOCLOPRAMIDE HCL 5 MG/ML IJ SOLN: 5.0000 mg | Freq: Three times a day (TID) | INTRAMUSCULAR | Status: DC | PRN

## 2015-05-04 MED ORDER — BUPIVACAINE-EPINEPHRINE (PF) 0.5% -1:200000 IJ SOLN
INTRAMUSCULAR | Status: AC
  Filled 2015-05-04: qty 30

## 2015-05-04 MED ORDER — CEFAZOLIN SODIUM-DEXTROSE 2-3 GM-% IV SOLR
INTRAVENOUS | Status: AC
  Filled 2015-05-04: qty 50

## 2015-05-04 MED ORDER — BUPIVACAINE-EPINEPHRINE 0.25% -1:200000 IJ SOLN
INTRAMUSCULAR | Status: AC
  Filled 2015-05-04: qty 1

## 2015-05-04 MED ORDER — HYDROMORPHONE HCL 1 MG/ML IJ SOLN: 0.5000 mg | INTRAMUSCULAR | Status: DC | PRN

## 2015-05-04 MED ORDER — BUPIVACAINE LIPOSOME 1.3 % IJ SUSP
20.0000 mL | Freq: Once | INTRAMUSCULAR | Status: DC
  Filled 2015-05-04: qty 20

## 2015-05-04 MED ORDER — DEXAMETHASONE SODIUM PHOSPHATE 10 MG/ML IJ SOLN
INTRAMUSCULAR | Status: AC
  Filled 2015-05-04: qty 1

## 2015-05-04 MED ORDER — SUFENTANIL CITRATE 50 MCG/ML IV SOLN
INTRAVENOUS | Status: AC
  Filled 2015-05-04: qty 1

## 2015-05-04 MED ORDER — ACETAMINOPHEN 10 MG/ML IV SOLN
INTRAVENOUS | Status: AC
  Filled 2015-05-04: qty 100

## 2015-05-04 SURGICAL SUPPLY — 57 items
APL SKNCLS STERI-STRIP NONHPOA (GAUZE/BANDAGES/DRESSINGS) ×1
BAG SPEC THK2 15X12 ZIP CLS (MISCELLANEOUS)
BAG ZIPLOCK 12X15 (MISCELLANEOUS) ×1 IMPLANT
BANDAGE ELASTIC 6 VELCRO ST LF (GAUZE/BANDAGES/DRESSINGS) ×2 IMPLANT
BANDAGE ESMARK 6X9 LF (GAUZE/BANDAGES/DRESSINGS) ×1 IMPLANT
BENZOIN TINCTURE PRP APPL 2/3 (GAUZE/BANDAGES/DRESSINGS) ×2 IMPLANT
BNDG CMPR 9X6 STRL LF SNTH (GAUZE/BANDAGES/DRESSINGS) ×1
BNDG COHESIVE 6X5 TAN STRL LF (GAUZE/BANDAGES/DRESSINGS) ×1 IMPLANT
BNDG ESMARK 6X9 LF (GAUZE/BANDAGES/DRESSINGS) ×3
BNDG GAUZE ELAST 4 BULKY (GAUZE/BANDAGES/DRESSINGS) ×5 IMPLANT
CLOSURE WOUND 1/2 X4 (GAUZE/BANDAGES/DRESSINGS) ×1
DECANTER SPIKE VIAL GLASS SM (MISCELLANEOUS) ×1 IMPLANT
DRAPE C-ARM 42X120 X-RAY (DRAPES) ×2 IMPLANT
DRAPE EXTREMITY T 121X128X90 (DRAPE) ×2 IMPLANT
DRAPE U-SHAPE 47X51 STRL (DRAPES) ×2 IMPLANT
DRSG EMULSION OIL 3X16 NADH (GAUZE/BANDAGES/DRESSINGS) ×3 IMPLANT
DRSG PAD ABDOMINAL 8X10 ST (GAUZE/BANDAGES/DRESSINGS) ×5 IMPLANT
DURAPREP 26ML APPLICATOR (WOUND CARE) ×3 IMPLANT
ELECT REM PT RETURN 9FT ADLT (ELECTROSURGICAL) ×3
ELECTRODE REM PT RTRN 9FT ADLT (ELECTROSURGICAL) ×1 IMPLANT
GAUZE SPONGE 4X4 12PLY STRL (GAUZE/BANDAGES/DRESSINGS) ×3 IMPLANT
GLOVE BIO SURGEON STRL SZ7.5 (GLOVE) ×5 IMPLANT
GLOVE BIO SURGEON STRL SZ8 (GLOVE) ×5 IMPLANT
GLOVE BIOGEL PI IND STRL 8 (GLOVE) ×3 IMPLANT
GLOVE BIOGEL PI INDICATOR 8 (GLOVE) ×4
GOWN STRL REUS W/TWL LRG LVL3 (GOWN DISPOSABLE) ×3 IMPLANT
GOWN STRL REUS W/TWL XL LVL3 (GOWN DISPOSABLE) ×3 IMPLANT
IMMOBILIZER KNEE 20 (SOFTGOODS) ×2 IMPLANT
K-WIRE 1.6 SHOU (Wire) ×4 IMPLANT
KIT BASIN OR (CUSTOM PROCEDURE TRAY) ×3 IMPLANT
MANIFOLD NEPTUNE II (INSTRUMENTS) ×3 IMPLANT
NDL MA TROC 1/2 (NEEDLE) IMPLANT
NEEDLE HYPO 22GX1.5 SAFETY (NEEDLE) ×2 IMPLANT
NEEDLE MA TROC 1/2 (NEEDLE) ×3 IMPLANT
NS IRRIG 1000ML POUR BTL (IV SOLUTION) ×2 IMPLANT
PACK TOTAL JOINT (CUSTOM PROCEDURE TRAY) ×3 IMPLANT
PAD ABD 8X10 STRL (GAUZE/BANDAGES/DRESSINGS) ×4 IMPLANT
PADDING CAST COTTON 6X4 STRL (CAST SUPPLIES) ×2 IMPLANT
PASSER SUT SWANSON 36MM LOOP (INSTRUMENTS) ×1 IMPLANT
POSITIONER SURGICAL ARM (MISCELLANEOUS) ×3 IMPLANT
STRIP CLOSURE SKIN 1/2X4 (GAUZE/BANDAGES/DRESSINGS) ×2 IMPLANT
SUCTION FRAZIER HANDLE 12FR (TUBING) ×2
SUCTION TUBE FRAZIER 12FR DISP (TUBING) IMPLANT
SUT ETHIBOND NAB CT1 #1 30IN (SUTURE) ×2 IMPLANT
SUT FIBERWIRE #5 38 BLUE (WIRE) ×2 IMPLANT
SUT MNCRL AB 3-0 PS2 18 (SUTURE) ×2 IMPLANT
SUT MNCRL AB 4-0 PS2 18 (SUTURE) ×1 IMPLANT
SUT VIC AB 0 CT1 27 (SUTURE) ×3
SUT VIC AB 0 CT1 27XBRD ANTBC (SUTURE) ×2 IMPLANT
SUT VIC AB 1 CT1 27 (SUTURE)
SUT VIC AB 1 CT1 27XBRD ANTBC (SUTURE) ×1 IMPLANT
SUT VIC AB 2-0 CT1 27 (SUTURE) ×6
SUT VIC AB 2-0 CT1 TAPERPNT 27 (SUTURE) ×1 IMPLANT
SYR CONTROL 10ML LL (SYRINGE) ×2 IMPLANT
TOWEL OR 17X26 10 PK STRL BLUE (TOWEL DISPOSABLE) ×4 IMPLANT
WATER STERILE IRR 1500ML POUR (IV SOLUTION) ×1 IMPLANT
WRAP KNEE MAXI GEL POST OP (GAUZE/BANDAGES/DRESSINGS) ×2 IMPLANT

## 2015-05-04 NOTE — Anesthesia Procedure Notes (Signed)
Procedure Name: Intubation Date/Time: 05/04/2015 4:50 PM Performed by: Leroy LibmanEARDON, Austen Wygant L Patient Re-evaluated:Patient Re-evaluated prior to inductionOxygen Delivery Method: Circle system utilized Preoxygenation: Pre-oxygenation with 100% oxygen Intubation Type: IV induction Ventilation: Mask ventilation without difficulty and Oral airway inserted - appropriate to patient size Laryngoscope Size: Hyacinth MeekerMiller and 2 Grade View: Grade I Tube type: Oral Tube size: 7.5 mm Number of attempts: 1 Airway Equipment and Method: Stylet Placement Confirmation: ETT inserted through vocal cords under direct vision,  breath sounds checked- equal and bilateral and positive ETCO2 Secured at: 22 cm Tube secured with: Tape Dental Injury: Teeth and Oropharynx as per pre-operative assessment

## 2015-05-04 NOTE — Transfer of Care (Signed)
Immediate Anesthesia Transfer of Care Note  Patient: Kyle Snyder  Procedure(s) Performed: Procedure(s): OPEN REDUCTION INTERNAL (ORIF) FIXATION PATELLA (Left)  Patient Location: PACU  Anesthesia Type:General  Level of Consciousness: awake  Airway & Oxygen Therapy: Patient Spontanous Breathing and Patient connected to face mask oxygen  Post-op Assessment: Report given to RN and Post -op Vital signs reviewed and stable  Post vital signs: Reviewed and stable  Last Vitals:  Filed Vitals:   05/04/15 1338  BP: 135/71  Pulse: 70  Temp: 37.4 C  Resp: 16    Complications: No apparent anesthesia complications

## 2015-05-04 NOTE — Brief Op Note (Signed)
Kyle Snyder [id number] 05/04/2015   PRE-OP DIAGNOSIS: left patella fracture  POST-OP DIAGNOSIS: same  PROCEDURE: ORIF left patella fracture  ANESTHESIA: general  [name]   Dictation #:  [id number]   Needs to remain non-weightbearing and in his brace until follow-up with us in a week.
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
## 2015-05-04 NOTE — Progress Notes (Signed)
Kyle Snyder ZOX:[id number] DOB: 1981-02-10 DOA: 05/02/2015 PCP: No PCP Per Patient  Brief narrative: 34 y/o ? Prior h/o Hand laceration Admitted with paranoid psychotic behavior and delirious He was noted to have a gun next is mild EtOH but no IVC by psychiatry CK total 7664 noted a AKI  Having some discomfort and pain Frustrated about lab draws   Past medical history-As per Problem list Chart reviewed as below-   Consultants:  Rhetta Mura  Psychiatry  Procedures:    Antibiotics:     Subjective   Doing ok Hungry and thirsty No other issues Some mild back pains    Objective    Interim History:   Telemetry:    Objective: Filed Vitals:   05/03/15 0634 05/03/15 1300 05/03/15 2053 05/04/15 0459  BP: 154/80 136/68 134/76 136/74  Pulse: 90 73 79 74  Temp: 98.4 F (36.9 C) 98.1 F (36.7 C) 98.7 F (37.1 C) 99.5 F (37.5 C)  TempSrc: Oral Oral Oral Oral  Resp: Height:  (1.753 m)     Weight: 60.7 kg (133 lb 13.1 oz)     SpO2: 100% 99% 100% 99%    Intake/Output Summary (Last 24 hours) at 05/04/15 1032 Last data filed at 05/04/15 0531  Gross per 24 hour  Intake 6448.34 ml  Output      0 ml  Net 6448.34 ml    Exam:  General: eomi ncat Cardiovascular: s1 s 2no m/r/g Respiratory: clear no added sound Abdomen: soft nt nd Skin multiplem tattoos Neurono le edema nor other periph edema  Data Reviewed: Basic Metabolic Panel:  Recent Labs Lab 05/03/15 0202 05/03/15 0820 05/03/15 1010 05/03/15 1210 05/03/15 1336  NA 137 141 140 138  138 139  K 3.6 4.1 4.0 4.1  4.1 3.5  CL 104 108 108 107  106 106  CO2 20* 19* 22 21*  23 24  GLUCOSE 81 79 124* 88  92 139*  BUN CREATININE 1.59* 1.43* 1.33* 1.28*  1.25* 1.18  CALCIUM 8.4* 8.0* 7.9* 8.0*  8.1* 8.1*  PHOS  --  3.9 3.4 3.7 4.3   Liver Function Tests:  Recent Labs Lab 05/03/15 0820 05/03/15 1010 05/03/15 1210 05/03/15 1336    ALBUMIN 3.9 3.5 3.5 3.3*   No results for input(s): LIPASE, AMYLASE in the last 168 hours. No results for input(s): AMMONIA in the last 168 hours. CBC:  Recent Labs Lab 05/02/15 1920  WBC 6.3  NEUTROABS 3.7  HGB 15.4  HCT 46.2  MCV 92.4  PLT 284   Cardiac Enzymes:  Recent Labs Lab 05/03/15 1210 05/03/15 1510 05/03/15 1955 05/03/15 2327 05/04/15 0500  CKTOTAL 81191* 47829* 22260* 21095* 18858*   BNP: Invalid input(s): POCBNP CBG: No results for input(s): GLUCAP in the last 168 hours.  Recent Results (from the past 240 hour(s))  Surgical pcr screen     Status: None   Collection Time: 05/04/15  5:26 AM  Result Value Ref Range Status   MRSA, PCR NEGATIVE NEGATIVE Final   Staphylococcus aureus NEGATIVE NEGATIVE Final    Comment:        The Xpert SA Assay (FDA approved for NASAL specimens in patients over 1 years of age), is one component of a comprehensive surveillance program.  Test performance has been validated by [facility] for patients greater than or equal to 79 year old. It is not intended to diagnose infection nor to guide  or monitor treatment.      Studies:              All Imaging reviewed and is as per above notation   Scheduled Meds:  Continuous Infusions: . sodium chloride 200 mL/hr at 05/04/15 0300  .  sodium bicarbonate  infusion 1000 mL 100 mL/hr at 05/04/15 0732     Assessment/Plan:   1. Traumatic Rhabdomyolysis-Alkalinize urine with Bicarb Gtt 150 mEq @ and continue Copious IV saline 200 cc/hr.  Rpt Bmet q 12 and monitor CK checks-this seems to be resolving slowly. Urinary dipsticks indicate adequate alkalinization of urine so can cut back rate a little.  Patient /nursing aware to collect all urine and strict i/o's 2. Patellar # on L knee-for probable surgery later today if okayed by Ortho and Anesthesia 3. Possible drug use and paranoid behaviour on admission-IVC recinded  Code Status: presuemd full Family Communication:  GF at  bedside present but sleeping  Disposition Plan: inpatient pending resolution with surgery   Pleas KochJai Jimmie Rueter, MD  [facility] Pager [phone] 05/04/2015, 10:32 AM    LOS: 1 day

## 2015-05-04 NOTE — Anesthesia Preprocedure Evaluation (Addendum)
Anesthesia Evaluation  Patient identified by MRN, date of birth, ID band Patient awake    Reviewed: Allergy & Precautions, NPO status , Patient's Chart, lab work & pertinent test results  History of Anesthesia Complications Negative for: history of anesthetic complications  Airway Mallampati: II  TM Distance: >3 FB Neck ROM: Full    Dental  (+) Teeth Intact, Dental Advisory Given   Pulmonary Current Smoker,    Pulmonary exam normal breath sounds clear to auscultation       Cardiovascular Exercise Tolerance: Good negative cardio ROS Normal cardiovascular exam Rhythm:Regular Rate:Normal     Neuro/Psych negative neurological ROS     GI/Hepatic negative GI ROS, Neg liver ROS,   Endo/Other  negative endocrine ROS  Renal/GU Renal disease     Musculoskeletal negative musculoskeletal ROS (+)   Abdominal   Peds  Hematology negative hematology ROS (+)   Anesthesia Other Findings Day of surgery medications reviewed with the patient.  Reproductive/Obstetrics                            Anesthesia Physical Anesthesia Plan  ASA: II  Anesthesia Plan: General   Post-op Pain Management:    Induction: Intravenous  Airway Management Planned: Oral ETT  Additional Equipment:   Intra-op Plan:   Post-operative Plan: Extubation in OR  Informed Consent: I have reviewed the patients History and Physical, chart, labs and discussed the procedure including the risks, benefits and alternatives for the proposed anesthesia with the patient or authorized representative who has indicated his/her understanding and acceptance.   Dental advisory given  Plan Discussed with: CRNA  Anesthesia Plan Comments: (Risks/benefits of general anesthesia discussed with patient including risk of damage to teeth, lips, gum, and tongue, nausea/vomiting, allergic reactions to medications, and the possibility of heart attack,  stroke and death.  All patient questions answered.  Patient wishes to proceed.)        Anesthesia Quick Evaluation

## 2015-05-04 NOTE — Anesthesia Postprocedure Evaluation (Signed)
  Anesthesia Post-op Note  Patient: Kyle Snyder  Procedure(s) Performed: Procedure(s) (LRB): OPEN REDUCTION INTERNAL (ORIF) FIXATION PATELLA (Left)  Patient Location: PACU  Anesthesia Type: General  Level of Consciousness: awake and alert   Airway and Oxygen Therapy: Patient Spontanous Breathing  Post-op Pain: mild  Post-op Assessment: Post-op Vital signs reviewed, Patient's Cardiovascular Status Stable, Respiratory Function Stable, Patent Airway and No signs of Nausea or vomiting  Last Vitals:  Filed Vitals:   05/04/15 2002  BP: 127/74  Pulse: 66  Temp: 37.3 C  Resp: 16    Post-op Vital Signs: stable   Complications: No apparent anesthesia complications

## 2015-05-05 ENCOUNTER — Encounter (HOSPITAL_COMMUNITY): Payer: Self-pay | Admitting: Orthopaedic Surgery

## 2015-05-05 LAB — URINALYSIS, DIPSTICK ONLY
BILIRUBIN URINE: NEGATIVE
BILIRUBIN URINE: NEGATIVE
Bilirubin Urine: NEGATIVE
Bilirubin Urine: NEGATIVE
Bilirubin Urine: NEGATIVE
GLUCOSE, UA: 250 mg/dL — AB
GLUCOSE, UA: 500 mg/dL — AB
GLUCOSE, UA: NEGATIVE mg/dL
Glucose, UA: >1000 mg/dL — AB
Glucose, UA: NEGATIVE mg/dL
HGB URINE DIPSTICK: NEGATIVE
HGB URINE DIPSTICK: NEGATIVE
HGB URINE DIPSTICK: NEGATIVE
HGB URINE DIPSTICK: NEGATIVE
Hgb urine dipstick: NEGATIVE
KETONES UR: NEGATIVE mg/dL
KETONES UR: NEGATIVE mg/dL
Ketones, ur: NEGATIVE mg/dL
Ketones, ur: NEGATIVE mg/dL
Ketones, ur: NEGATIVE mg/dL
LEUKOCYTES UA: NEGATIVE
LEUKOCYTES UA: NEGATIVE
LEUKOCYTES UA: NEGATIVE
Leukocytes, UA: NEGATIVE
Leukocytes, UA: NEGATIVE
NITRITE: NEGATIVE
NITRITE: NEGATIVE
NITRITE: NEGATIVE
Nitrite: NEGATIVE
Nitrite: NEGATIVE
PH: 8 (ref 5.0–8.0)
PH: 7.5 (ref 5.0–8.0)
PH: 7.5 (ref 5.0–8.0)
PROTEIN: NEGATIVE mg/dL
Protein, ur: NEGATIVE mg/dL
Protein, ur: NEGATIVE mg/dL
Protein, ur: NEGATIVE mg/dL
Protein, ur: NEGATIVE mg/dL
SPECIFIC GRAVITY, URINE: 1.01 (ref 1.005–1.030)
SPECIFIC GRAVITY, URINE: 1.008 (ref 1.005–1.030)
SPECIFIC GRAVITY, URINE: 1.006 (ref 1.005–1.030)
SPECIFIC GRAVITY, URINE: 1.004 — AB (ref 1.005–1.030)
Specific Gravity, Urine: 1.012 (ref 1.005–1.030)
UROBILINOGEN UA: 1 mg/dL (ref 0.0–1.0)
UROBILINOGEN UA: 1 mg/dL (ref 0.0–1.0)
UROBILINOGEN UA: 1 mg/dL (ref 0.0–1.0)
Urobilinogen, UA: 1 mg/dL (ref 0.0–1.0)
Urobilinogen, UA: 1 mg/dL (ref 0.0–1.0)
pH: 8 (ref 5.0–8.0)
pH: 7 (ref 5.0–8.0)

## 2015-05-05 LAB — URINALYSIS, ROUTINE W REFLEX MICROSCOPIC
BILIRUBIN URINE: NEGATIVE
Glucose, UA: NEGATIVE mg/dL
Hgb urine dipstick: NEGATIVE
Ketones, ur: NEGATIVE mg/dL
LEUKOCYTES UA: NEGATIVE
NITRITE: NEGATIVE
PH: 7.5 (ref 5.0–8.0)
Protein, ur: NEGATIVE mg/dL
SPECIFIC GRAVITY, URINE: 1.008 (ref 1.005–1.030)
UROBILINOGEN UA: 1 mg/dL (ref 0.0–1.0)

## 2015-05-05 LAB — BASIC METABOLIC PANEL
ANION GAP: 8 (ref 5–15)
Anion gap: 7 (ref 5–15)
BUN: 6 mg/dL (ref 6–20)
CALCIUM: 8.6 mg/dL — AB (ref 8.9–10.3)
CHLORIDE: 103 mmol/L (ref 101–111)
CHLORIDE: 104 mmol/L (ref 101–111)
CO2: 27 mmol/L (ref 22–32)
CO2: 28 mmol/L (ref 22–32)
CREATININE: 0.88 mg/dL (ref 0.61–1.24)
Calcium: 8.3 mg/dL — ABNORMAL LOW (ref 8.9–10.3)
Creatinine, Ser: 0.94 mg/dL (ref 0.61–1.24)
GFR calc Af Amer: >60 mL/min (ref >60)
GFR calc non Af Amer: >60 mL/min (ref >60)
GFR calc non Af Amer: >60 mL/min (ref >60)
GLUCOSE: 170 mg/dL — AB (ref 65–99)
Glucose, Bld: 160 mg/dL — ABNORMAL HIGH (ref 65–99)
POTASSIUM: 4 mmol/L (ref 3.5–5.1)
POTASSIUM: 4.1 mmol/L (ref 3.5–5.1)
SODIUM: 137 mmol/L (ref 135–145)
Sodium: 140 mmol/L (ref 135–145)

## 2015-05-05 LAB — CK
CK TOTAL: 11130 U/L — AB (ref 49–397)
Total CK: 12599 U/L — ABNORMAL HIGH (ref 49–397)
Total CK: 9853 U/L — ABNORMAL HIGH (ref 49–397)
Total CK: 8821 U/L — ABNORMAL HIGH (ref 49–397)

## 2015-05-05 MED ORDER — OXYCODONE-ACETAMINOPHEN 5-325 MG PO TABS
1.0000 | ORAL_TABLET | ORAL | Status: DC | PRN
  Administered 2015-05-05 – 2015-05-06 (×5): 2 via ORAL
  Filled 2015-05-05 (×5): qty 2

## 2015-05-05 NOTE — Progress Notes (Signed)
Subjective: 1 Day Post-Op Procedure(s) (LRB): OPEN REDUCTION INTERNAL (ORIF) FIXATION PATELLA (Left)  Activity level:  Nonweightbearing left leg. Must have knee immobilizer on at all times. Diet tolerance:  ok Voiding:  ok Patient reports pain as mild.    Objective: Vital signs in last 24 hours: Temp:  [97.4 F (36.3 C)-99.2 F (37.3 C)] 98.5 F (36.9 C) (11/11 1000) Pulse Rate:  [66-85] 72 (11/11 1000) Resp:  [11-18] 18 (11/11 1000) BP: (122-149)/(62-94) 149/86 mmHg (11/11 1000) SpO2:  [96 %-100 %] 98 % (11/11 1000)  Labs:  Recent Labs  05/02/15 1920  HGB 15.4    Recent Labs  05/02/15 1920  WBC 6.3  RBC 5.00  HCT 46.2  PLT 284    Recent Labs  05/05/15 0712 05/05/15 1106  NA 137 140  K 4.0 4.1  CL 103 104  CO2 27 28  BUN <5* 6  CREATININE 0.88 0.94  GLUCOSE 170* 160*  CALCIUM 8.3* 8.6*   No results for input(s): LABPT, INR in the last 72 hours.  Physical Exam:  Neurologically intact ABD soft Neurovascular intact Sensation intact distally Intact pulses distally Dorsiflexion/Plantar flexion intact Incision: dressing C/D/I and no drainage No cellulitis present Compartment soft  Assessment/Plan:  1 Day Post-Op Procedure(s) (LRB): OPEN REDUCTION INTERNAL (ORIF) FIXATION PATELLA (Left) Advance diet Up with therapy  From an orthopaedic standpoint he is doing well and is cleared to go home. Follow up in office 1 week. He needs to be in knee immobilizer at all times. Nonweightbearing left leg. We appreciate medicines management. Keep bandage clean and dry until follow up.    Kyle Snyder, Kyle Snyder 05/05/2015, 3:33 PM

## 2015-05-05 NOTE — Op Note (Signed)
NAMLannette Donath:  XXXPARKER, Kyle           ACCOUNT NO.:  [id number]  MEDICAL RECORD NO.:  [id number]  LOCATION:  1614                         [facility]  PHYSICIAN:  Lubertha BasquePeter G. Monee Dembeck, M.D.DATE OF BIRTH:  02-18-1981  DATE OF PROCEDURE:  05/04/2015 DATE OF DISCHARGE:                              OPERATIVE REPORT   PREOPERATIVE DIAGNOSIS:  Left patella fracture.  POSTOPERATIVE DIAGNOSIS:  Left patella fracture.  PROCEDURE:  Left patella open reduction and internal fixation.  ANESTHESIA:  General.  ATTENDING SURGEON:  Lubertha BasquePeter G. Jerl Santosalldorf, M.D.  ASSISTANT:  Elodia FlorenceAndrew Nida, PA.  INDICATION FOR PROCEDURE:  The patient is a 34 year old man who experienced what was probably a psychotic break and was running from voices.  He fell and sustained an injury and not managed to continue running.  Eventually, he presented to [facility] and was admitted by the Medicine Service.  Orthopedics was consulted about a patella fracture.  By x-ray, he had a widely displaced patella fracture, which was mildly comminuted.  He was unable to do a straight leg raise and had no quadriceps function consequently.  He is offered ORIF in hopes of re- establishing his extensor mechanism and allowing him to have a functional leg.  Informed operative consent was obtained after discussion of possible complications including reaction to anesthesia, infection, failure of fixation.  SUMMARY OF FINDINGS AND PROCEDURE:  Under general anesthesia through a longitudinal incision, ORIF of the left patella was performed.  This was more comminuted than appreciated on initial plain films.  Nevertheless, we achieved a fairly good reduction and stabilized with two longitudinal K-wires and a figure-of-eight tension band of #5 FiberWire.  He was close primarily and had been back to the Medicine Service.  DESCRIPTION OF PROCEDURE:  The patient was taken to the operating suite where general anesthetic was applied without difficulty.   He was positioned supine and prepped and draped in normal sterile fashion. After the administration of preop IV Kefzol and an appropriate time-out, the left leg was elevated, exsanguinated, and tourniquet inflated about the thigh.  A longitudinal incision was made and centered to the fracture site.  Dissection was carried down to the fracture site. Hemarthrosis was evacuated.  I then removed some devitalized tissues from both aspects of the fracture.  He had several small fragments, which I could not shave and these were removed.  He had two major fragments and I did my best of piece together the smaller fragments that I could salvage as well.  This was brought together with several towel clips followed by placement of two longitudinal 0.062 K-wires.  These were found to be in adequate position by fluoroscopy.  I then placed a #5 FiberWire in figure-of-eight tension band fashion and tied this appropriately.  I then ranged the knee up to 90 degrees of flexion, the fixation seemed to be very stable.  I bent the K-wires proximally and rotated about 90 degrees and then pushed these down into the quadriceps. Distally, I cut the K-wires about 5 or 6 mm distal to the FiberWire and bent this towards the patellar tendon.  Again fluoroscopy was used to confirm adequate placement of hardware and reduction of fracture and I read these views  myself.  We released the tourniquet and a small amount of bleeding was easily controlled with some pressure.  We irrigated and then closed with 0 and 2-0 undyed Vicryl followed by a subcuticular stitch and some Steri-Strips.  We injected some Exparel with Marcaine towards the end of the case.  Adaptic was applied to the wound followed by dry gauze and a loose Ace wrap followed by a knee immobilizer. Estimated blood loss and intraoperative fluids can be obtained from anesthesia records as can accurate tourniquet time.  DISPOSITION:  The patient was extubated in  the operating room and taken to the recovery room in stable addition.  He was to be admitted back to the Medicine Service for appropriate postop care.  He is going to hopefully remain nonweightbearing in a knee immobilizer until followup.     Lubertha Basque Jerl Santos, M.D.     PGD/MEDQ  D:  05/04/2015  T:  05/05/2015  Job:  [id number]

## 2015-05-05 NOTE — Evaluation (Signed)
Physical Therapy Evaluation Patient Details Name: Esau GrewShawron XXXParker MRN: [id number] DOB: [birth year] Today's Date: 05/05/2015   History of Present Illness  ORIF L patella fx  Clinical Impression  Pt s/p L patella ORIF presents with functional mobility limitations 2* post op pain, no ROM L knee and NWB on L LE.  Pt mobillizing today at supervision level with crutches and maintaining NWB on L with no difficulty.      Follow Up Recommendations No PT follow up    Equipment Recommendations  None recommended by PT    Recommendations for Other Services       Precautions / Restrictions Precautions Precautions: Fall;Other (comment) Precaution Comments: NO ROM L knee Required Braces or Orthoses: Knee Immobilizer - Left Knee Immobilizer - Left: On at all times Restrictions Weight Bearing Restrictions: Yes LLE Weight Bearing: Non weight bearing      Mobility  Bed Mobility Overal bed mobility: Modified Independent             General bed mobility comments: Pt self assisting L LE with UE  Transfers Overall transfer level: Needs assistance Equipment used: Rolling walker (2 wheeled);Crutches Transfers: Sit to/from Stand Sit to Stand: Min guard;Supervision         General transfer comment: cues for LE management and use of UEs to self assist  Ambulation/Gait Ambulation/Gait assistance: Min guard;Supervision Ambulation Distance (Feet): 280 Feet Assistive device: Rolling walker (2 wheeled);Crutches Gait Pattern/deviations: Shuffle;Trunk flexed     General Gait Details: Pt ambulated 6050' with RW and S and additional 230' with crutches and minguard progressing to Sup.  VCs for posture and stride length,  Pt demonstrating good ability to maintain NWB on L  Stairs Stairs: Yes Stairs assistance: Min guard Stair Management: No rails;Forwards;With crutches;Step to pattern Number of Stairs: 2 (single step twice) General stair comments: cues for LE management   Wheelchair  Mobility    Modified Rankin (Stroke Patients Only)       Balance                                             Pertinent Vitals/Pain Pain Assessment: 0-10 Pain Score: 3  Pain Location: L knee Pain Descriptors / Indicators: Aching;Sore Pain Intervention(s): Limited activity within patient's tolerance;Monitored during session;Premedicated before session;Ice applied    Home Living Family/patient expects to be discharged to:: Private residence Living Arrangements: Spouse/significant other Available Help at Discharge: Family Type of Home: House Home Access: Level entry     Home Layout: One level Home Equipment: Crutches      Prior Function Level of Independence: Independent with assistive device(s)               Hand Dominance        Extremity/Trunk Assessment   Upper Extremity Assessment: Overall WFL for tasks assessed           Lower Extremity Assessment: LLE deficits/detail   LLE Deficits / Details: KI in place - no ROM at knee  Cervical / Trunk Assessment: Normal  Communication   Communication: No difficulties  Cognition Arousal/Alertness: Awake/alert Behavior During Therapy: WFL for tasks assessed/performed Overall Cognitive Status: Within Functional Limits for tasks assessed                      General Comments      Exercises        Assessment/Plan  PT Assessment Patient needs continued PT services  PT Diagnosis Difficulty walking   PT Problem List Decreased strength;Decreased range of motion;Decreased mobility;Decreased knowledge of use of DME  PT Treatment Interventions DME instruction;Gait training;Stair training;Functional mobility training;Therapeutic activities;Patient/family education   PT Goals (Current goals can be found in the Care Plan section) Acute Rehab PT Goals Patient Stated Goal: HOME PT Goal Formulation: With patient Time For Goal Achievement: 05/08/15 Potential to Achieve Goals: Good     Frequency Min 6X/week   Barriers to discharge        Co-evaluation               End of Session Equipment Utilized During Treatment: Gait belt;Left knee immobilizer Activity Tolerance: Patient tolerated treatment well Patient left: in chair;with call bell/phone within reach;with family/visitor present Nurse Communication: Mobility status         Time: 0920-0954 PT Time Calculation (min) (ACUTE ONLY): 34 min   Charges:   PT Evaluation $Initial PT Evaluation Tier I: 1 Procedure PT Treatments $Gait Training: 8-22 mins   PT G Codes:        Sriyan Cutting 2015-05-18, 12:45 PM

## 2015-05-05 NOTE — Clinical Social Work Note (Signed)
CSW spoke with Dr. Shela CommonsJ. Who advised that he was unsure if patient's IVC was valid because he was unaware as to whether the second certification had been done. D J. Advised CSW that if the second certification had been completed, CSW would need to have the attending sign the Notice of Commitment Change form to recind the IVC.  CSW checked and found that the second certification had been completed.  CSW had the form signed by patient's attending and faxed completed change form to Cassie Watts to recind the IVC.  CSW placed completed Commitment Change form and fax confirmation in patient's chart.   CSW signing off.   Annice NeedySettle, Joeli Fenner D, [place]CS[place] [phone]

## 2015-05-05 NOTE — Progress Notes (Signed)
Kyle Snyder WUX:[id number] DOB: 28-Oct-1980 DOA: 05/02/2015 PCP: No PCP Per Patient  Brief narrative: 34 y/o ? Prior h/o Hand laceration Admitted with paranoid psychotic behavior and delirious He was noted to have a gun next is mild EtOH but no IVC by psychiatry  CK total 7664 noted AKI as well  Also noted to have comminuted patellar # L side   Ortho consulted   Past medical history-As per Problem list Chart reviewed as below-   Consultants:  Ortho Dalldorf  Psychiatry  Procedures:    Antibiotics:     Subjective   Fair Leg pain mana will be n/v tol diet  passing urine No cp    Objective    Interim History:   Telemetry:  - Objective: : Filed Vitals:   05/04/15 2300 05/05/15 0140 05/05/15 0617 05/05/15 1000  BP: 132/64 143/74 146/81 149/86  Pulse: 79 75 67 72  Temp: 98.6 F (37 C) 98.2 F (36.8 C) 98.2 F (36.8 C) 98.5 F (36.9 C)  TempSrc: Oral Oral Oral   Resp: Height:      Weight:      SpO2: 99% 99% 98% 98%    Intake/Output Summary (Last 24 hours) at 05/05/15 1413 Last data filed at 05/05/15 1104  Gross per 24 hour  Intake 4720.83 ml  Output   3625 ml  Net 1095.83 ml    Exam:  General: eomi ncat Cardiovascular: s1 s 2no m/r/g Respiratory: clear no added sound Abdomen: soft nt nd Skin multiplem tattoos Neurono le edema nor other periph edema  Data Reviewed: Basic Metabolic Panel:  Recent Labs Lab 05/03/15 0820 05/03/15 1010 05/03/15 1210 05/03/15 1336 05/04/15 1117 05/04/15 2245 05/05/15 0712 05/05/15 1106  NA 141 140 138  138 139 139 141 137 140  K 4.1 4.0 4.1  4.1 3.5 3.3* 4.2 4.0 4.1  CL 108 108 107  106 106 104 104 103 104  CO2 19* 22 21*  GLUCOSE 79 124* 88  92 139* 113* 233* 170* 160*  BUN <5* 6 <5* 6  CREATININE 1.43* 1.33* 1.28*  1.25* 1.18 0.90 1.12 0.88 0.94  CALCIUM 8.0* 7.9* 8.0*  8.1* 8.1* 7.9* 8.8* 8.3* 8.6*  PHOS 3.9 3.4 3.7 4.3  --    --   --   --    Liver Function Tests:  Recent Labs Lab 05/03/15 0820 05/03/15 1010 05/03/15 1210 05/03/15 1336  ALBUMIN 3.9 3.5 3.5 3.3*   No results for input(s): LIPASE, AMYLASE in the last 168 hours. No results for input(s): AMMONIA in the last 168 hours. CBC:  Recent Labs Lab 05/02/15 1920  WBC 6.3  NEUTROABS 3.7  HGB 15.4  HCT 46.2  MCV 92.4  PLT 284   Cardiac Enzymes:  Recent Labs Lab 05/04/15 1308 05/04/15 2245 05/05/15 0300 05/05/15 0712 05/05/15 1106  CKTOTAL 25366* 12599* 11130* 9853* 8821*   BNP: Invalid input(s): POCBNP CBG: No results for input(s): GLUCAP in the last 168 hours.  Recent Results (from the past 240 hour(s))  Surgical pcr screen     Status: None   Collection Time: 05/04/15  5:26 AM  Result Value Ref Range Status   MRSA, PCR NEGATIVE NEGATIVE Final   Staphylococcus aureus NEGATIVE NEGATIVE Final    Comment:        The Xpert SA Assay (FDA approved for NASAL specimens in patients over 13 years of age), is one  component of a comprehensive surveillance program.  Test performance has been validated by [facility] for patients greater than or equal to 35 year old. It is not intended to diagnose infection nor to guide or monitor treatment.      Studies:              All Imaging reviewed and is as per above notation   Scheduled Meds:  Continuous Infusions:     Assessment/Plan:   1. Traumatic Rhabdomyolysis-Alkalinize urine with Bicarb Gtt 150 mEq @ and continue Copious IV saline 200 cc/hr-->D/c all fluids 11/11 as significant improvement.  Force PO fluids change labs from q12-->daily-this seems to be resolving slowly.  4.6 liters + today for admission 2. Patellar # s/p Orthopedic management.  Appreciate input.  Eval PT shows no acute OP needs' 3. Possible drug use and paranoid behaviour on admission-IVC recinded.  Patient to be d/c home on d/c  Code Status: presumed full Family Communication:  Family + at  bedside Disposition Plan: inpatient pending resolution -likely can d/c hopme am   Pleas KochJai Andriana Casa, MD  [facility] Pager [phone] 05/05/2015, 2:13 PM    LOS: 2 days

## 2015-05-06 LAB — BASIC METABOLIC PANEL
Anion gap: 7 (ref 5–15)
BUN: 8 mg/dL (ref 6–20)
CO2: 28 mmol/L (ref 22–32)
Calcium: 8.9 mg/dL (ref 8.9–10.3)
Chloride: 104 mmol/L (ref 101–111)
Creatinine, Ser: 0.99 mg/dL (ref 0.61–1.24)
GFR calc Af Amer: >60 mL/min (ref >60)
GFR calc non Af Amer: >60 mL/min (ref >60)
Glucose, Bld: 112 mg/dL — ABNORMAL HIGH (ref 65–99)
Potassium: 3.8 mmol/L (ref 3.5–5.1)
Sodium: 139 mmol/L (ref 135–145)

## 2015-05-06 LAB — URINALYSIS, DIPSTICK ONLY
Bilirubin Urine: NEGATIVE
Bilirubin Urine: NEGATIVE
Bilirubin Urine: NEGATIVE
GLUCOSE, UA: NEGATIVE mg/dL
Glucose, UA: NEGATIVE mg/dL
Glucose, UA: NEGATIVE mg/dL
HGB URINE DIPSTICK: NEGATIVE
Hgb urine dipstick: NEGATIVE
Hgb urine dipstick: NEGATIVE
KETONES UR: NEGATIVE mg/dL
Ketones, ur: NEGATIVE mg/dL
Ketones, ur: NEGATIVE mg/dL
LEUKOCYTES UA: NEGATIVE
LEUKOCYTES UA: NEGATIVE
Leukocytes, UA: NEGATIVE
NITRITE: NEGATIVE
NITRITE: NEGATIVE
Nitrite: NEGATIVE
PH: 7.5 (ref 5.0–8.0)
PH: 7.5 (ref 5.0–8.0)
PROTEIN: NEGATIVE mg/dL
Protein, ur: NEGATIVE mg/dL
Protein, ur: NEGATIVE mg/dL
SPECIFIC GRAVITY, URINE: 1.005 (ref 1.005–1.030)
SPECIFIC GRAVITY, URINE: 1.012 (ref 1.005–1.030)
Specific Gravity, Urine: 1.01 (ref 1.005–1.030)
UROBILINOGEN UA: 1 mg/dL (ref 0.0–1.0)
UROBILINOGEN UA: 1 mg/dL (ref 0.0–1.0)
Urobilinogen, UA: 1 mg/dL (ref 0.0–1.0)
pH: 7 (ref 5.0–8.0)

## 2015-05-06 LAB — CK: Total CK: 4252 U/L — ABNORMAL HIGH (ref 49–397)

## 2015-05-06 MED ORDER — OXYCODONE-ACETAMINOPHEN 5-325 MG PO TABS: 1.0000 | ORAL_TABLET | ORAL | Status: AC | PRN

## 2015-05-06 MED ORDER — METHOCARBAMOL 500 MG PO TABS: 500.0000 mg | ORAL_TABLET | Freq: Four times a day (QID) | ORAL | Status: AC | PRN

## 2015-05-06 NOTE — Progress Notes (Signed)
Discharged from floor via w/c, belongings & family with pt. No changes in assessment. Sabrine Patchen  
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
## 2018-07-29 ENCOUNTER — Emergency Department: Payer: BLUE CROSS/BLUE SHIELD

## 2018-07-29 ENCOUNTER — Emergency Department
Admission: EM | Admit: 2018-07-29 | Discharge: 2018-07-30 | Disposition: A | Discharge status: Home / Self Care | Payer: BLUE CROSS/BLUE SHIELD | Attending: Emergency Medicine | Admitting: Emergency Medicine

## 2018-07-29 DIAGNOSIS — M79605 Pain in left leg: Secondary | ICD-10-CM | POA: Insufficient documentation

## 2018-07-29 MED ORDER — CYCLOBENZAPRINE HCL 10 MG PO TABS
10.00 mg | ORAL_TABLET | Freq: Once | ORAL | Status: AC
Start: 2018-07-29 — End: 2018-07-29
  Administered 2018-07-29: 10 mg via ORAL
  Filled 2018-07-29: qty 1

## 2018-07-29 MED ORDER — HYDROCODONE-ACETAMINOPHEN 5-325 MG PO TABS
1.0000 | ORAL_TABLET | Freq: Once | ORAL | Status: AC
Start: 2018-07-29 — End: 2018-07-29
  Administered 2018-07-29: 1 via ORAL
  Filled 2018-07-29: qty 1
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
## 2018-07-30 ENCOUNTER — Emergency Department: Payer: BLUE CROSS/BLUE SHIELD

## 2018-07-30 LAB — URINALYSIS REFLEX TO MICROSCOPIC EXAM - REFLEX TO CULTURE
Bilirubin, UA: NEGATIVE
Blood, UA: NEGATIVE
Glucose, UA: NEGATIVE
Ketones UA: NEGATIVE
Leukocyte Esterase, UA: NEGATIVE
Nitrite, UA: NEGATIVE
Protein, UR: NEGATIVE
Specific Gravity UA: 1.025 (ref 1.001–1.035)
Urine pH: 6 (ref 5.0–8.0)
Urobilinogen, UA: 0.2 mg/dL (ref 0.2–2.0)

## 2018-07-30 MED ORDER — HYDROCODONE-ACETAMINOPHEN 5-325 MG PO TABS
1.00 | ORAL_TABLET | Freq: Four times a day (QID) | ORAL | 0 refills | Status: AC | PRN
Start: 2018-07-30 — End: 2018-08-13

## 2018-07-30 MED ORDER — METHOCARBAMOL 750 MG PO TABS
750.00 mg | ORAL_TABLET | Freq: Three times a day (TID) | ORAL | 0 refills | Status: AC | PRN
Start: 2018-07-30 — End: ?

## 2018-07-30 MED ORDER — HYDROCODONE-ACETAMINOPHEN 5-325 MG PO TABS
1.00 | ORAL_TABLET | Freq: Once | ORAL | Status: AC
Start: 2018-07-30 — End: 2018-07-30
  Administered 2018-07-30: 02:00:00 1 via ORAL
  Filled 2018-07-30: qty 1

## 2018-07-30 MED ORDER — IBUPROFEN 400 MG PO TABS
800.00 mg | ORAL_TABLET | Freq: Once | ORAL | Status: AC
Start: 2018-07-30 — End: 2018-07-30
  Administered 2018-07-30: 02:00:00 800 mg via ORAL
  Filled 2018-07-30: qty 2

## 2018-07-30 MED ORDER — NAPROXEN 500 MG PO TABS
500.00 mg | ORAL_TABLET | Freq: Two times a day (BID) | ORAL | 0 refills | Status: AC
Start: 2018-07-30 — End: ?
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
## 2018-08-28 ENCOUNTER — Emergency Department: Payer: BLUE CROSS/BLUE SHIELD

## 2018-08-28 ENCOUNTER — Emergency Department
Admission: EM | Admit: 2018-08-28 | Discharge: 2018-08-28 | Disposition: A | Discharge status: Home / Self Care | Payer: BLUE CROSS/BLUE SHIELD | Attending: Emergency Medicine | Admitting: Emergency Medicine

## 2018-08-28 DIAGNOSIS — W19XXXA Unspecified fall, initial encounter: Secondary | ICD-10-CM

## 2018-08-28 DIAGNOSIS — S40029A Contusion of unspecified upper arm, initial encounter: Secondary | ICD-10-CM

## 2018-08-28 DIAGNOSIS — S80812A Abrasion, left lower leg, initial encounter: Secondary | ICD-10-CM | POA: Insufficient documentation

## 2018-08-28 DIAGNOSIS — S40022A Contusion of left upper arm, initial encounter: Secondary | ICD-10-CM | POA: Insufficient documentation

## 2018-08-28 DIAGNOSIS — T148XXA Other injury of unspecified body region, initial encounter: Secondary | ICD-10-CM

## 2018-08-28 DIAGNOSIS — W11XXXA Fall on and from ladder, initial encounter: Secondary | ICD-10-CM | POA: Insufficient documentation

## 2018-08-28 DIAGNOSIS — S8012XA Contusion of left lower leg, initial encounter: Secondary | ICD-10-CM | POA: Insufficient documentation

## 2018-08-28 LAB — URINALYSIS REFLEX TO MICROSCOPIC EXAM - REFLEX TO CULTURE
Bilirubin, UA: NEGATIVE
Blood, UA: NEGATIVE
Glucose, UA: NEGATIVE
Ketones UA: NEGATIVE
Leukocyte Esterase, UA: NEGATIVE
Nitrite, UA: NEGATIVE
Protein, UR: NEGATIVE
Specific Gravity UA: 1.025 (ref 1.001–1.035)
Urine pH: 6 (ref 5.0–8.0)
Urobilinogen, UA: 0.2 mg/dL (ref 0.2–2.0)

## 2018-08-28 MED ORDER — METHOCARBAMOL 750 MG PO TABS
750.00 mg | ORAL_TABLET | Freq: Three times a day (TID) | ORAL | 0 refills | Status: AC | PRN
Start: 2018-08-28 — End: ?

## 2018-08-28 MED ORDER — IBUPROFEN 400 MG PO TABS
800.0000 mg | ORAL_TABLET | Freq: Once | ORAL | Status: DC
Start: 2018-08-28 — End: 2018-08-29

## 2018-08-28 MED ORDER — NAPROXEN 500 MG PO TABS
500.0000 mg | ORAL_TABLET | Freq: Two times a day (BID) | ORAL | 0 refills | Status: DC
Start: 2018-08-28 — End: 2018-08-28

## 2018-08-28 MED ORDER — CYCLOBENZAPRINE HCL 10 MG PO TABS
10.00 mg | ORAL_TABLET | Freq: Once | ORAL | Status: AC
Start: 2018-08-28 — End: 2018-08-28
  Administered 2018-08-28: 22:00:00 10 mg via ORAL
  Filled 2018-08-28: qty 1

## 2018-08-28 MED ORDER — HYDROCODONE-ACETAMINOPHEN 5-325 MG PO TABS
1.00 | ORAL_TABLET | Freq: Four times a day (QID) | ORAL | 0 refills | Status: AC | PRN
Start: 2018-08-28 — End: 2018-09-11

## 2018-08-28 MED ORDER — METHOCARBAMOL 750 MG PO TABS
750.0000 mg | ORAL_TABLET | Freq: Three times a day (TID) | ORAL | 0 refills | Status: DC | PRN
Start: 2018-08-28 — End: 2018-08-28

## 2018-08-28 MED ORDER — NAPROXEN 500 MG PO TABS
500.00 mg | ORAL_TABLET | Freq: Two times a day (BID) | ORAL | 0 refills | Status: AC
Start: 2018-08-28 — End: ?

## 2018-08-28 MED ORDER — HYDROCODONE-ACETAMINOPHEN 5-325 MG PO TABS
2.0000 | ORAL_TABLET | Freq: Once | ORAL | Status: AC
Start: 2018-08-28 — End: 2018-08-28
  Administered 2018-08-28: 22:00:00 2 via ORAL
  Filled 2018-08-28: qty 2

## 2018-08-28 MED ORDER — HYDROCODONE-ACETAMINOPHEN 5-325 MG PO TABS
1.0000 | ORAL_TABLET | Freq: Four times a day (QID) | ORAL | 0 refills | Status: DC | PRN
Start: 2018-08-28 — End: 2018-08-28

## 2018-08-28 NOTE — ED Triage Notes (Signed)
Patient states falling off ladder about 8 ft after doing some house work outside. Denies hitting head, but thinks he hit everything else on the way down. C/o posterior left leg pain, posterior bilateral arms, and left flank pain.
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
## 2018-08-28 NOTE — Discharge Instructions (Signed)
Fall Prevention (Edu)     You asked for information on Fall Prevention.     There was a 2003 study from the [employer] of the [employer] on fall-related injuries. It shows that more than 1.8 million adults, aged 39 and older, were treated in emergency departments for such injuries. More than 421,000 were hospitalized. The most common fall injuries are head injuries. These in turn cause brain injury and fractures (broken bones). Hip fractures are the most serious types of broken bones that happen from falls. They lead to the most health problems and deaths.     To make the living area safer, older adults should:  · Improve lighting throughout the home. Use night-lights to help see at night.  · Have handrails put in on both sides of stairways.  · Have grab bars put next to the toilet and in the shower. Also think about getting an elevated (high) toilet seat and a shower chair.  · Use non-slip bath mats in the tub or shower.  · Take out "throw rugs" to prevent tripping.  · Avoid long robes to prevent tripping.  · Wear well-fitted shoes or slippers. Loose footwear can make you shuffle. This makes you more likely to trip and fall. You can also buy inexpensive anti-slip socks.  · Keep all electrical cords and small objects out of the pathway.  · Any cane, walker or other assistive device used needs to be checked regularly. The devices must be used correctly to prevent injuries.  · Move about at a pace that is comfortable for your ability. For example, do not rush to answer the doorbell or phone. Take your time.     Recent studies have identified some risk factors that make older adults more likely to have falls. Changing these risk factors helps to prevent falls.  · Exercise: Regular physical activity or exercise make the body stronger. They also improve balance.  · Medicine Review: Follow up with your doctor and pharmacist as needed to review your medicines and any new changes. They can tell you if there  are side-effects or drug interactions (if the medicines affect other medicines you are taking). If you are taking sedatives or sleeping pills, it may be possible to lower the dosage or number of medicines. These kinds of medicines can cause drowsiness and dizziness. This makes it more likely you will fall.  · Vision Checks: Follow up with an eye doctor at least once a year to have your vision checked.              Contusion     You have been diagnosed with a contusion.     A contusion is a bruise. A contusion occurs when something strikes or hits the body. This breaks small blood vessels called capillaries. When the capillaries break, blood leaks out. This makes the skin look red, purple, blue, or black. The injured area may hurt for a few days. If you take a blood thinner like warfarin (Coumadin®) the bruising may be worse.     Apply ice to the bruise. Avoid using the injured body part.     Apply ice to help with pain and swelling. Put some ice cubes in a re-sealable plastic bag (like Ziploc®). Add some water. Seal the bag. Put a thin washcloth between the bag and the skin. Apply the ice bag for at least 20 minutes. Do this at least 4 times per day. It’s okay to apply ice longer or more often. NEVER APPLY ICE DIRECTLY TO THE SKIN. Always   keep a washcloth between the ice pack and your body.     YOU SHOULD SEEK MEDICAL ATTENTION IMMEDIATELY, EITHER HERE OR AT THE NEAREST EMERGENCY DEPARTMENT, IF ANY OF THE FOLLOWING OCCURS:  · Your pain or swelling gets much worse.  · You develop new numbness or tingling in or below the affected area.  · Your foot or hand looks cold or pale. This could mean there is a problem with circulation (blood supply).              Abrasion     You have been diagnosed with an abrasion. This is a scrape of the outer skin layers.     Take off old dressings every day. Then put on a clean, dry dressing. If the dressing sticks to the wound, moisten it with water. This way, it can come off more  easily.     Keep the wound clean and dry for the next 24 hours. You can wash the wound gently with soap and water. Then put on a dry bandage if needed, to protect it.     Put a thin layer of antibiotic ointment on the wound 2-3 times a day. This can be Polysporin® / triple antibiotic. This can help prevent infection. It may help keep scarring to a minimum.     YOU SHOULD SEEK MEDICAL ATTENTION IMMEDIATELY, EITHER HERE OR AT THE NEAREST EMERGENCY DEPARTMENT, IF ANY OF THE FOLLOWING OCCURS:  · Unusual redness or swelling.  · There are red streaks going up the arm or leg.  · The wound smells bad or has a lot of drainage.  · Fever (temperature higher than 100.4ºF / 38ºC), chills, more pain and / or swelling.

## 2022-07-05 ENCOUNTER — Emergency Department
Admission: EM | Admit: 2022-07-05 | Discharge: 2022-07-05 | Disposition: A | Discharge status: Home / Self Care | Payer: Commercial Managed Care - POS | Attending: Emergency Medicine | Admitting: Emergency Medicine

## 2022-07-05 DIAGNOSIS — R202 Paresthesia of skin: Secondary | ICD-10-CM | POA: Insufficient documentation

## 2022-07-05 DIAGNOSIS — M79606 Pain in leg, unspecified: Secondary | ICD-10-CM | POA: Insufficient documentation

## 2022-07-05 DIAGNOSIS — M791 Myalgia, unspecified site: Secondary | ICD-10-CM

## 2022-07-05 DIAGNOSIS — U071 COVID-19: Secondary | ICD-10-CM | POA: Insufficient documentation

## 2022-07-05 LAB — COMPREHENSIVE METABOLIC PANEL
ALT: 23 U/L (ref 0–55)
AST (SGOT): 28 U/L (ref 5–41)
Albumin/Globulin Ratio: 1.4 (ref 0.9–2.2)
Albumin: 4.5 g/dL (ref 3.5–5.0)
Alkaline Phosphatase: 58 U/L (ref 37–117)
Anion Gap: 12 (ref 5.0–15.0)
BUN: 6 mg/dL — ABNORMAL LOW (ref 9.0–28.0)
Bilirubin, Total: 0.4 mg/dL (ref 0.2–1.2)
CO2: 26 mEq/L (ref 17–29)
Calcium: 9.4 mg/dL (ref 8.5–10.5)
Chloride: 103 mEq/L (ref 99–111)
Creatinine: 1 mg/dL (ref 0.5–1.5)
Globulin: 3.3 g/dL (ref 2.0–3.6)
Glucose: 106 mg/dL — ABNORMAL HIGH (ref 70–100)
Potassium: 3.9 mEq/L (ref 3.5–5.3)
Protein, Total: 7.8 g/dL (ref 6.0–8.3)
Sodium: 141 mEq/L (ref 135–145)
eGFR: >60 mL/min/{1.73_m2} (ref >=60)

## 2022-07-05 LAB — COVID-19 (SARS-COV-2) & INFLUENZA  A/B, NAA (ROCHE LIAT)
Influenza A: NOT DETECTED
Influenza B: NOT DETECTED
SARS CoV 2 Overall Result: DETECTED — AB

## 2022-07-05 LAB — CBC AND DIFFERENTIAL
Absolute NRBC: 0 10*3/uL (ref 0.00–0.00)
Basophils Absolute Automated: 0.03 10*3/uL (ref 0.00–0.08)
Basophils Automated: 0.4 %
Eosinophils Absolute Automated: 0.02 10*3/uL (ref 0.00–0.44)
Eosinophils Automated: 0.3 %
Hematocrit: 44.9 % (ref 37.6–49.6)
Hgb: 14.4 g/dL (ref 12.5–17.1)
Immature Granulocytes Absolute: 0.02 10*3/uL (ref 0.00–0.07)
Immature Granulocytes: 0.3 %
Instrument Absolute Neutrophil Count: 5.28 10*3/uL (ref 1.10–6.33)
Lymphocytes Absolute Automated: 0.43 10*3/uL (ref 0.42–3.22)
Lymphocytes Automated: 6.4 %
MCH: 29.3 pg (ref 25.1–33.5)
MCHC: 32.1 g/dL (ref 31.5–35.8)
MCV: 91.4 fL (ref 78.0–96.0)
MPV: 9.8 fL (ref 8.9–12.5)
Monocytes Absolute Automated: 0.93 10*3/uL — ABNORMAL HIGH (ref 0.21–0.85)
Monocytes: 13.9 %
Neutrophils Absolute: 5.28 10*3/uL (ref 1.10–6.33)
Neutrophils: 78.7 %
Nucleated RBC: 0 /100 WBC (ref 0.0–0.0)
Platelets: 239 10*3/uL (ref 142–346)
RBC: 4.91 10*6/uL (ref 4.20–5.90)
RDW: 13 % (ref 11–15)
WBC: 6.71 10*3/uL (ref 3.10–9.50)

## 2022-07-05 LAB — LIPASE: Lipase: 13 U/L (ref 8–78)

## 2022-07-05 LAB — CK: Creatine Kinase (CK): 212 U/L (ref 47–294)

## 2022-07-05 MED ORDER — ACETAMINOPHEN 500 MG PO TABS
1000.0000 mg | ORAL_TABLET | Freq: Once | ORAL | Status: AC
Start: 2022-07-05 — End: 2022-07-05
  Administered 2022-07-05: 1000 mg via ORAL
  Filled 2022-07-05: qty 2

## 2022-07-05 MED ORDER — SODIUM CHLORIDE 0.9 % IV BOLUS
1000.00 mL | Freq: Once | INTRAVENOUS | Status: AC
Start: 2022-07-05 — End: 2022-07-05
  Administered 2022-07-05: 1000 mL via INTRAVENOUS

## 2022-07-05 MED ORDER — KETOROLAC TROMETHAMINE 30 MG/ML IJ SOLN
30.00 mg | Freq: Once | INTRAMUSCULAR | Status: AC
Start: 2022-07-05 — End: 2022-07-05
  Administered 2022-07-05: 30 mg via INTRAVENOUS
  Filled 2022-07-05: qty 1

## 2022-07-05 NOTE — ED Provider Notes (Signed)
EMERGENCY DEPARTMENT NOTE     Patient initially seen and examined at   ED PHYSICIAN ASSIGNED       Date/Time Event User Comments    07/05/22 0741 Physician Assigned Alcus Dad, DO assigned as Attending           ED MIDLEVEL (APP) ASSIGNED       None            HISTORY OF PRESENT ILLNESS       Chief Complaint: Leg Pain and Tingling       42 y.o. male with past medical history as below presents to the ed with bilateral arms and legs tingling, pressure that started last night and continued this am.  Denies trauma, fall or injury. He c/o slight headache. Denies neck or back pain.  Denies cp or sob. Denies coughing. Denies abdominal pain. Denies n/v/d.  Denies fever or chills.     Independent Historian (other than patient): No  Additional History Provided by Independent Historian:  MEDICAL HISTORY     Past Medical History:  History reviewed. No pertinent past medical history.    Past Surgical History:  Past Surgical History:   Procedure Laterality Date    PATELLA SURGERY Left        Social History:  Social History     Socioeconomic History    Marital status: Single   Tobacco Use    Smoking status: Never    Smokeless tobacco: Never   Vaping Use    Vaping Use: Never used   Substance and Sexual Activity    Alcohol use: Yes     Comment: social    Drug use: Yes     Comment: marijuana     Social Determinants of Health     Food Insecurity: No Food Insecurity (07/05/2022)    Hunger Vital Sign     Worried About Running Out of Food in the Last Year: Never true     Ran Out of Food in the Last Year: Never true   Transportation Needs: No Transportation Needs (07/05/2022)    PRAPARE - Armed forces logistics/support/administrative officer (Medical): No     Lack of Transportation (Non-Medical): No   Intimate Partner Violence: Not At Risk (07/05/2022)    Humiliation, Afraid, Rape, and Kick questionnaire     Fear of Current or Ex-Partner: No     Emotionally Abused: No     Physically Abused: No     Sexually Abused: No   Housing  Stability: Unknown (07/05/2022)    Housing Stability Vital Sign     Unable to Pay for Housing in the Last Year: No     Unstable Housing in the Last Year: No       Family History:  History reviewed. No pertinent family history.    Outpatient Medication:  Previous Medications    METHOCARBAMOL (ROBAXIN) 750 MG TABLET    Take 1 tablet (750 mg total) by mouth 3 (three) times daily as needed (for spasm)    METHOCARBAMOL (ROBAXIN) 750 MG TABLET    Take 1 tablet (750 mg total) by mouth 3 (three) times daily as needed (for spasm)    NAPROXEN (NAPROSYN) 500 MG TABLET    Take 1 tablet (500 mg total) by mouth 2 (two) times daily with meals    NAPROXEN (NAPROSYN) 500 MG TABLET    Take 1 tablet (500 mg total) by mouth 2 (two) times daily with meals  REVIEW OF SYSTEMS   Review of Systems See History of Present Illness  PHYSICAL EXAM     ED Triage Vitals [07/05/22 0744]   Enc Vitals Group      BP 117/58      Heart Rate 99      Resp Rate 19      Temp 99.4 F (37.4 C)      Temp Source Oral      SpO2 97 %      Weight 71.7 kg      Height 1.753 m      Head Circumference       Peak Flow       Pain Score 10      Pain Loc       Pain Edu?       Excl. in GC?      Physical Exam   Nursing note and vitals reviewed.  Constitutional:  Well developed, well nourished. Awake & Oriented x3.  Head:  Atraumatic. Normocephalic.    Eyes:  PERRL. EOMI. Conjunctivae are not pale.  ENT:  Mucous membranes are dry  and intact. Oropharynx is clear and symmetric.  Patent airway.  Neck:  Supple. Full ROM.    Cardiovascular:  Regular rate. Regular rhythm. No murmurs, rubs, or gallops.  Pulmonary/Chest:  No evidence of respiratory distress. Clear to auscultation bilaterally.  No wheezing, rales or rhonchi.   Abdominal:  Soft and non-distended. There is no tenderness. No rebound, guarding, or rigidity.  Back:  Full ROM. Nontender.  Extremities:  No edema. No cyanosis. No clubbing. Full range of motion in all extremities.  mild diffuse tenderness of legs. No  swelling. No erythema.  Pt able to extend legs without difficulty.  + 2/4 PP bilaterally. + 2/4 RP bilaterally.   Skin:  Skin is warm and dry.  No diaphoresis. No rash.   Neurological:  Alert, awake, and appropriate. Normal speech. Motor normal.  Psychiatric:  Good eye contact. Normal interaction, affect, and behavior.    MEDICAL DECISION MAKING   IVF for hydration    Cbc, cmp normal    Lipase and CK are normal.        Patient is positive for COVID-19 infection.  Influenza a and B-.  Symptoms likely due to COVID-19 virus infection.  Instructed to take Tylenol and Motrin for pain.  Instructed to drink plenty of fluids.  Instructed to follow-up with PCP and return for worsening symptoms.  Patient agrees with the plan    PRIMARY PROBLEM LIST      Acute illness/injury DIAGNOSIS: COVID-19 virus infection and myalgias     Differential Diagnosis: URI (adult): pleuritis, URI, pneumonia, viral syndrome, asthma/COPD exacerbation, respiratory failure, sepsis, pulmonary edema, myocarditis, COVID19     DISCUSSION              External Records Reviewed?: N/A    Additional Notes      Prescription medications considered and not given: Antibiotics considered and not given as symptoms felt to be viral in etiology               Vital Signs: Reviewed the patient's vital signs.   Nursing Notes: Reviewed and utilized available nursing notes.  Medical Records Reviewed: Reviewed available past medical records.  Counseling: The emergency provider has spoken with the patient and discussed today's findings, in addition to providing specific details for the plan of care.  Questions are answered and there is agreement with the plan.  MIPS DOCUMENTATION              CARDIAC STUDIES    The following cardiac studies were independently interpreted by me the Emergency Medicine Provider.  For full cardiac study results please see chart.                                                EMERGENCY IMAGING STUDIES    The following imagine studies were  independently interpreted by me (emergency medicine provider):                       RADIOLOGY IMAGING STUDIES      No orders to display       EMERGENCY DEPT. MEDICATIONS      ED Medication Orders (From admission, onward)      Start Ordered     Status Ordering Provider    07/05/22 0803 07/05/22 0802  acetaminophen (TYLENOL) tablet 1,000 mg  Once        Route: Oral  Ordered Dose: 1,000 mg       Last MAR action: Given Delle Reining    07/05/22 0802 07/05/22 0801  sodium chloride 0.9 % bolus 1,000 mL  Once        Route: Intravenous  Ordered Dose: 1,000 mL       Last [facility] action: [place] G    07/05/22 0802 07/05/22 0801  ketorolac (TORADOL) injection 30 mg  Once        Route: Intravenous  Ordered Dose: 30 mg       Last MAR action: Given Twylla Arceneaux G            LABORATORY RESULTS    Ordered and independently interpreted AVAILABLE laboratory tests.   Results       Procedure Component Value Units Date/Time    COVID-19 (SARS-CoV-2) and Influenza A/B, NAA ([facility]) [[id number]]  (Abnormal) Collected: 07/05/22 0804    Specimen: Culturette from Nasopharyngeal Updated: 07/05/22 0839     Purpose of COVID testing Diagnostic -[facility]     SARS-CoV-2 Specimen Source Nasal Swab     SARS CoV 2 Overall Result Detected     Influenza A Not Detected     Influenza B Not Detected    Narrative:      o Collect and clearly label specimen type:  o PREFERRED-Upper respiratory specimen: One Nasal Swab in  Transport Media.  o Hand deliver to laboratory ASAP  Diagnostic -[facility]    Comprehensive metabolic panel [[id number]]  (Abnormal) Collected: 07/05/22 0804    Specimen: Blood Updated: 07/05/22 0829     Glucose 106 mg/dL      BUN 6.0 mg/dL      Creatinine 1.0 mg/dL      Sodium 141 mEq/L      Potassium 3.9 mEq/L      Chloride 103 mEq/L      CO2 26 mEq/L      Calcium 9.4 mg/dL      Protein, Total 7.8 g/dL      Albumin 4.5 g/dL      AST (SGOT) 28 U/L      ALT 23 U/L      Alkaline Phosphatase 58 U/L      Bilirubin, Total 0.4 mg/dL       Globulin 3.3 g/dL  Albumin/Globulin Ratio 1.4     Anion Gap 12.0     eGFR >60.0 mL/min/1.73 m2     Lipase [[id number]] Collected: 07/05/22 0804    Specimen: Blood Updated: 07/05/22 0829     Lipase 13 U/L     Creatine Kinase (CK) [[id number]] Collected: 07/05/22 0804    Specimen: Blood Updated: 07/05/22 0829     Creatine Kinase (CK) 212 U/L     CBC and differential [[id number]]  (Abnormal) Collected: 07/05/22 0804    Specimen: Blood Updated: 07/05/22 0817     WBC 6.71 x10 3/uL      Hgb 14.4 g/dL      Hematocrit 08.6 %      Platelets 239 x10 3/uL      RBC 4.91 x10 6/uL      MCV 91.4 fL      MCH 29.3 pg      MCHC 32.1 g/dL      RDW 13 %      MPV 9.8 fL      Instrument Absolute Neutrophil Count 5.28 x10 3/uL      Neutrophils 78.7 %      Lymphocytes Automated 6.4 %      Monocytes 13.9 %      Eosinophils Automated 0.3 %      Basophils Automated 0.4 %      Immature Granulocytes 0.3 %      Nucleated RBC 0.0 /100 WBC      Neutrophils Absolute 5.28 x10 3/uL      Lymphocytes Absolute Automated 0.43 x10 3/uL      Monocytes Absolute Automated 0.93 x10 3/uL      Eosinophils Absolute Automated 0.02 x10 3/uL      Basophils Absolute Automated 0.03 x10 3/uL      Immature Granulocytes Absolute 0.02 x10 3/uL      Absolute NRBC 0.00 x10 3/uL               CRITICAL CARE/PROCEDURES    Procedures    DIAGNOSIS      Diagnosis:  Final diagnoses:   COVID-19 virus infection   Myalgia       Disposition:  ED Disposition       ED Disposition   Discharge    Condition   --    Date/Time   Fri Jul 05, 2022  9:21 AM    Comment   Gerrie Nordmann discharge to home/self care.    Condition at disposition: Stable                 Prescriptions:  Patient's Medications   New Prescriptions    No medications on file   Previous Medications    METHOCARBAMOL (ROBAXIN) 750 MG TABLET    Take 1 tablet (750 mg total) by mouth 3 (three) times daily as needed (for spasm)    METHOCARBAMOL (ROBAXIN) 750 MG TABLET    Take 1 tablet (750 mg total) by mouth 3 (three) times  daily as needed (for spasm)    NAPROXEN (NAPROSYN) 500 MG TABLET    Take 1 tablet (500 mg total) by mouth 2 (two) times daily with meals    NAPROXEN (NAPROSYN) 500 MG TABLET    Take 1 tablet (500 mg total) by mouth 2 (two) times daily with meals   Modified Medications    No medications on file   Discontinued Medications    No medications on file           This note was generated by  the Epic EMR system/ Dragon speech recognition and may contain inherent errors or omissions not intended by the user. Grammatical errors, random word insertions, deletions and pronoun errors  are occasional consequences of this technology due to software limitations. Not all errors are caught or corrected. If there are questions or concerns about the content of this note or information contained within the body of this dictation they should be addressed directly with the author for clarification.           Loetta Rough, DO  07/05/22 [phone]

## 2022-07-05 NOTE — ED Triage Notes (Signed)
Pt reports constant pain and tingling to bilateral legs/arms that started last night. Pt denies decreased sensation or weakness. Pain is from the ankles to the hips, denies injury or back pain. Pt has (+)pulses, cap refill <2, (+)movement
